# Patient Record
Sex: Female | Born: 1995 | Race: Black or African American | Hispanic: No | Marital: Single | Smoking: Never smoker
Health system: Southern US, Community
[De-identification: ages and names within clinical notes are randomized; demographics above are authoritative.]

## PROBLEM LIST (undated history)

## (undated) ENCOUNTER — Inpatient Hospital Stay (HOSPITAL_COMMUNITY): Payer: Self-pay

## (undated) DIAGNOSIS — O139 Gestational [pregnancy-induced] hypertension without significant proteinuria, unspecified trimester: Secondary | ICD-10-CM

## (undated) HISTORY — DX: Gestational (pregnancy-induced) hypertension without significant proteinuria, unspecified trimester: O13.9

---

## 2015-01-08 NOTE — L&D Delivery Note (Signed)
20 y.o. [id number] at 6676w5d delivered a viable female infant in cephalic, ROA position. Tight nuchal cord, easily reduced.  Anterior shoulder delivered with ease. 60 sec delayed cord clamping. Cord clamped x2 and cut. Placenta delivered spontaneously intact, with 3VC. Fundus firm on exam with massage and pitocin. Good hemostasis noted.  Laceration: None Suture: N/A Good hemostasis noted. EBL: 100 cc   Mom and baby recovering in LDR.    Apgars: 5, 8, 9  Code Apgar called.  Cord pH:  Weight: pending   Freddrick MarchYashika Amin, MD PGY-1 12/18/2015, 3:41 AM

## 2015-05-03 ENCOUNTER — Encounter (HOSPITAL_COMMUNITY): Payer: Self-pay | Admitting: Emergency Medicine

## 2015-05-03 ENCOUNTER — Emergency Department (HOSPITAL_COMMUNITY)
Admission: EM | Admit: 2015-05-03 | Discharge: 2015-05-03 | Disposition: A | Discharge status: Home / Self Care | Payer: Medicaid Other | Attending: Emergency Medicine | Admitting: Emergency Medicine

## 2015-05-03 ENCOUNTER — Emergency Department (HOSPITAL_COMMUNITY): Payer: Medicaid Other

## 2015-05-03 DIAGNOSIS — Z3490 Encounter for supervision of normal pregnancy, unspecified, unspecified trimester: Secondary | ICD-10-CM

## 2015-05-03 DIAGNOSIS — R109 Unspecified abdominal pain: Secondary | ICD-10-CM | POA: Diagnosis not present

## 2015-05-03 DIAGNOSIS — O9989 Other specified diseases and conditions complicating pregnancy, childbirth and the puerperium: Secondary | ICD-10-CM | POA: Diagnosis present

## 2015-05-03 DIAGNOSIS — O219 Vomiting of pregnancy, unspecified: Secondary | ICD-10-CM | POA: Diagnosis not present

## 2015-05-03 DIAGNOSIS — Z3A01 Less than 8 weeks gestation of pregnancy: Secondary | ICD-10-CM | POA: Diagnosis not present

## 2015-05-03 LAB — URINE MICROSCOPIC-ADD ON

## 2015-05-03 LAB — COMPREHENSIVE METABOLIC PANEL
ALK PHOS: 38 U/L (ref 38–126)
ALT: 13 U/L — ABNORMAL LOW (ref 14–54)
ANION GAP: 11 (ref 5–15)
AST: 15 U/L (ref 15–41)
Albumin: 4.3 g/dL (ref 3.5–5.0)
BILIRUBIN TOTAL: 0.6 mg/dL (ref 0.3–1.2)
BUN: <5 mg/dL — ABNORMAL LOW (ref 6–20)
CALCIUM: 9.4 mg/dL (ref 8.9–10.3)
CO2: 19 mmol/L — ABNORMAL LOW (ref 22–32)
Chloride: 105 mmol/L (ref 101–111)
Creatinine, Ser: 0.66 mg/dL (ref 0.44–1.00)
GFR calc non Af Amer: >60 mL/min (ref >60)
GLUCOSE: 94 mg/dL (ref 65–99)
Potassium: 3.4 mmol/L — ABNORMAL LOW (ref 3.5–5.1)
Sodium: 135 mmol/L (ref 135–145)
TOTAL PROTEIN: 7.8 g/dL (ref 6.5–8.1)

## 2015-05-03 LAB — URINALYSIS, ROUTINE W REFLEX MICROSCOPIC
Glucose, UA: NEGATIVE mg/dL
Hgb urine dipstick: NEGATIVE
Ketones, ur: >80 mg/dL — AB
Leukocytes, UA: NEGATIVE
NITRITE: NEGATIVE
PROTEIN: 100 mg/dL — AB
SPECIFIC GRAVITY, URINE: 1.037 — AB (ref 1.005–1.030)
pH: 6.5 (ref 5.0–8.0)

## 2015-05-03 LAB — CBC
HCT: 38.6 % (ref 36.0–46.0)
HEMOGLOBIN: 13.2 g/dL (ref 12.0–15.0)
MCH: 25.9 pg — ABNORMAL LOW (ref 26.0–34.0)
MCHC: 34.2 g/dL (ref 30.0–36.0)
MCV: 75.7 fL — ABNORMAL LOW (ref 78.0–100.0)
PLATELETS: 218 10*3/uL (ref 150–400)
RBC: 5.1 MIL/uL (ref 3.87–5.11)
RDW: 13.2 % (ref 11.5–15.5)
WBC: 8.9 10*3/uL (ref 4.0–10.5)

## 2015-05-03 LAB — LIPASE, BLOOD: Lipase: 25 U/L (ref 11–51)

## 2015-05-03 LAB — POC URINE PREG, ED: PREG TEST UR: POSITIVE — AB

## 2015-05-03 MED ORDER — PRENATAL COMPLETE 14-0.4 MG PO TABS: 1.0000 | ORAL_TABLET | Freq: Every day | ORAL | Status: AC

## 2015-05-03 MED ORDER — ONDANSETRON 4 MG PO TBDP: 4.0000 mg | ORAL_TABLET | Freq: Three times a day (TID) | ORAL | Status: DC | PRN

## 2015-05-03 MED ORDER — KCL IN DEXTROSE-NACL 20-5-0.45 MEQ/L-%-% IV SOLN
Freq: Once | INTRAVENOUS | Status: AC
  Administered 2015-05-03: 250 mL/h via INTRAVENOUS
  Filled 2015-05-03 (×2): qty 1000

## 2015-05-03 MED ORDER — ONDANSETRON HCL 4 MG/2ML IJ SOLN
4.0000 mg | Freq: Once | INTRAMUSCULAR | Status: AC
  Administered 2015-05-03: 4 mg via INTRAVENOUS
  Filled 2015-05-03: qty 2

## 2015-05-03 NOTE — Discharge Instructions (Signed)
As discussed, today's evaluation has been reassuring but with your early pregnancy and is important to follow-up with our women's health specialists.  Please call today to make an appropriate appointment.  Please stay well hydrated.  Do not hesitate to return here for concerning changes in your condition.

## 2015-05-03 NOTE — ED Provider Notes (Signed)
CSN: [id number]     Arrival date & time 05/03/15  0440 History   First MD Initiated Contact with Patient 05/03/15 0703     Chief Complaint  Patient presents with  . Abdominal Pain     (Consider location/radiation/quality/duration/timing/severity/associated sxs/prior Treatment) HPI Patient presents with concern of abdominal pain, nausea, vomiting. Symptoms began 1 day ago. Last menstrual.  About 2 months ago. Patient is unsure of pregnancy, but thinks that she is likely pregnant. Patient states that she is otherwise well, has one prior pregnancy. Since onset, no clearly or alleviating factors, pain is lower, and upper, diffuse, moderate.  History reviewed. No pertinent past medical history. History reviewed. No pertinent past surgical history. No family history on file. Social History  Substance Use Topics  . Smoking status: Never Smoker   . Smokeless tobacco: None  . Alcohol Use: No   OB History    Gravida Para Term Preterm AB TAB SAB Ectopic Multiple Living   1              Review of Systems  Constitutional:       Per HPI, otherwise negative  HENT:       Per HPI, otherwise negative  Respiratory:       Per HPI, otherwise negative  Cardiovascular:       Per HPI, otherwise negative  Gastrointestinal: Positive for vomiting.  Endocrine:       Negative aside from HPI  Genitourinary:       Neg aside from HPI   Musculoskeletal:       Per HPI, otherwise negative  Skin: Negative.   Neurological: Negative for syncope.      Allergies  Review of patient's allergies indicates no known allergies.  Home Medications   Prior to Admission medications   Medication Sig Start Date End Date Taking? Authorizing Provider  ondansetron (ZOFRAN ODT) 4 MG disintegrating tablet Take 1 tablet (4 mg total) by mouth every 8 (eight) hours as needed for nausea or vomiting. 05/03/15   Gerhard Munchobert Emmalise Huard, MD  Prenatal Vit-Fe Fumarate-FA (PRENATAL COMPLETE) 14-0.4 MG TABS Take 1 tablet by mouth  daily. 05/03/15   Gerhard Munchobert Deepika Decatur, MD   BP 120/86 mmHg  Pulse 94  Temp(Src) 98.5 F (36.9 C) (Oral)  Resp 18  Ht 5' (1.524 m)  Wt 163 lb 1 oz (73.965 kg)  BMI 31.85 kg/m2  SpO2 99%  LMP 02/20/2015 (Approximate) Physical Exam  Constitutional: She is oriented to person, place, and time. She appears well-developed and well-nourished. No distress.  HENT:  Head: Normocephalic and atraumatic.  Eyes: Conjunctivae and EOM are normal.  Cardiovascular: Normal rate and regular rhythm.   Pulmonary/Chest: Effort normal and breath sounds normal. No stridor. No respiratory distress.  Abdominal: She exhibits no distension.  No substantial discomfort, mild guarding, no peritoneal findings  Musculoskeletal: She exhibits no edema.  Neurological: She is alert and oriented to person, place, and time. No cranial nerve deficit.  Skin: Skin is warm and dry.  Psychiatric: She has a normal mood and affect.  Nursing note and vitals reviewed.   ED Course  Procedures (including critical care time) Labs Review Labs Reviewed  COMPREHENSIVE METABOLIC PANEL - Abnormal; Notable for the following:    Potassium 3.4 (*)    CO2 19 (*)    BUN <5 (*)    ALT 13 (*)    All other components within normal limits  CBC - Abnormal; Notable for the following:    MCV 75.7 (*)  MCH 25.9 (*)    All other components within normal limits  URINALYSIS, ROUTINE W REFLEX MICROSCOPIC (NOT AT [facility]) - Abnormal; Notable for the following:    Color, Urine AMBER (*)    APPearance CLOUDY (*)    Specific Gravity, Urine 1.037 (*)    Bilirubin Urine SMALL (*)    Ketones, ur >80 (*)    Protein, ur 100 (*)    All other components within normal limits  URINE MICROSCOPIC-ADD ON - Abnormal; Notable for the following:    Squamous Epithelial / LPF 0-5 (*)    Bacteria, UA RARE (*)    All other components within normal limits  POC URINE PREG, ED - Abnormal; Notable for the following:    Preg Test, Ur POSITIVE (*)    All other  components within normal limits  LIPASE, BLOOD    Imaging Review US Ob Comp Less 14 Wks  05/03/2015  CLINICAL DATA:  Positive urine pregnancy test with pelvic pain for 1 day. EXAM: OBSTETRIC <14 WK [place] AND TRANSVAGINAL OB US TECHNIQUE: Both transabdominal and transvaginal ultrasound examinations were performed for complete evaluation of the gestation as well as the maternal uterus, adnexal regions, and pelvic cul-de-sac. Transvaginal technique was performed to assess early pregnancy. COMPARISON:  None. FINDINGS: Intrauterine gestational sac: Single, normal in configuration. Yolk sac:  Visualized. Embryo:  Visualized. Cardiac Activity: Visualized. Heart Rate: 132  bpm CRL:  0.95  mm   7 w   0 d                  [place] EDC: 12/20/2015 Subchorionic hemorrhage:  None visualized. Maternal uterus/adnexae: Within normal limits. Ovaries are visualized. No free fluid. IMPRESSION: Single living intrauterine pregnancy with gestational age of [redacted] weeks 0 days and estimated date of confinement 12/20/2015. No complicating features. Electronically Signed   By: Leanna Battles M.D.   On: 05/03/2015 07:59   US Ob Transvaginal  05/03/2015  CLINICAL DATA:  Positive urine pregnancy test with pelvic pain for 1 day. EXAM: OBSTETRIC <14 WK [place] AND TRANSVAGINAL OB US TECHNIQUE: Both transabdominal and transvaginal ultrasound examinations were performed for complete evaluation of the gestation as well as the maternal uterus, adnexal regions, and pelvic cul-de-sac. Transvaginal technique was performed to assess early pregnancy. COMPARISON:  None. FINDINGS: Intrauterine gestational sac: Single, normal in configuration. Yolk sac:  Visualized. Embryo:  Visualized. Cardiac Activity: Visualized. Heart Rate: 132  bpm CRL:  0.95  mm   7 w   0 d                  [place] EDC: 12/20/2015 Subchorionic hemorrhage:  None visualized. Maternal uterus/adnexae: Within normal limits. Ovaries are visualized. No free fluid. IMPRESSION: Single living intrauterine  pregnancy with gestational age of [redacted] weeks 0 days and estimated date of confinement 12/20/2015. No complicating features. Electronically Signed   By: Leanna Battles M.D.   On: 05/03/2015 07:59   Generally well-appearing female presents early in pregnancy with mild abdominal pain, nausea, vomiting. Here, the patient is mildly tachycardic, is nonischemic and urea, but improved substantially with fluid resuscitation with D5, half-normal saline. Ultrasound shows IUP, no evidence for free abdominal fluid, ectopic. Patient discharged with appropriate follow-up with our obstetrics team.   Gerhard Munch, MD 05/03/15 [phone]

## 2015-05-03 NOTE — ED Notes (Signed)
Patient with abdominal pain with nausea and vomiting.  This started yesterday.  LMP February 2017, she states that she is pregnant and does not know how far along she is.

## 2015-05-03 NOTE — ED Notes (Signed)
[facility] called, Christy OB nurse state to order a stat OB US to find out gestation time. EDP to be notified.

## 2015-07-14 ENCOUNTER — Inpatient Hospital Stay (HOSPITAL_COMMUNITY)
Admission: AD | Admit: 2015-07-14 | Discharge: 2015-07-14 | Disposition: A | Discharge status: Home / Self Care | Payer: Medicaid - Out of State | Source: Ambulatory Visit | Attending: Obstetrics & Gynecology | Admitting: Obstetrics & Gynecology

## 2015-07-14 ENCOUNTER — Encounter (HOSPITAL_COMMUNITY): Payer: Self-pay | Admitting: *Deleted

## 2015-07-14 DIAGNOSIS — R51 Headache: Secondary | ICD-10-CM | POA: Diagnosis present

## 2015-07-14 DIAGNOSIS — Z3A17 17 weeks gestation of pregnancy: Secondary | ICD-10-CM | POA: Diagnosis present

## 2015-07-14 DIAGNOSIS — O26892 Other specified pregnancy related conditions, second trimester: Secondary | ICD-10-CM | POA: Insufficient documentation

## 2015-07-14 DIAGNOSIS — Z711 Person with feared health complaint in whom no diagnosis is made: Secondary | ICD-10-CM

## 2015-07-14 DIAGNOSIS — O0932 Supervision of pregnancy with insufficient antenatal care, second trimester: Secondary | ICD-10-CM | POA: Diagnosis not present

## 2015-07-14 LAB — URINALYSIS, ROUTINE W REFLEX MICROSCOPIC
BILIRUBIN URINE: NEGATIVE
GLUCOSE, UA: NEGATIVE mg/dL
HGB URINE DIPSTICK: NEGATIVE
Ketones, ur: NEGATIVE mg/dL
Leukocytes, UA: NEGATIVE
Nitrite: NEGATIVE
PH: 5.5 (ref 5.0–8.0)
Protein, ur: NEGATIVE mg/dL
Specific Gravity, Urine: 1.025 (ref 1.005–1.030)

## 2015-07-14 NOTE — MAU Note (Signed)
Urine in lab 

## 2015-07-14 NOTE — MAU Provider Note (Signed)
History     CSN: [id number]  Arrival date and time: 07/14/15 1112   First Provider Initiated Contact with Patient 07/14/15 1201      No chief complaint on file.  HPI   Sydney Weaver is a 20 y.o. [id number] @[redacted]w[redacted]d  presenting with complaint of h/a yesterday. She states she got a h/a yesterday while watching tv. Rates it 5/10 pain localized at the front of her head. H/a lasted 2-3 hours and resolved upon drinking water and going to sleep. Denied any visual changes, vomiting, numbness or tingling. Normally gets these kinds of headaches once a month. She is not experiencing any pain or headache currently. Denies vaginal bleeding/discharge/loss of fluids, decreased fetal movements, or contractions. Patient has not established prenatal care since moving back to [place] a few weeks ago.   OB History    Gravida Para Term Preterm AB TAB SAB Ectopic Multiple Living   4    2  2          History reviewed. No pertinent past medical history.  History reviewed. No pertinent past surgical history.  History reviewed. No pertinent family history.  Social History  Substance Use Topics  . Smoking status: Never Smoker   . Smokeless tobacco: None  . Alcohol Use: No    Allergies: No Known Allergies  Prescriptions prior to admission  Medication Sig Dispense Refill Last Dose  . ondansetron (ZOFRAN ODT) 4 MG disintegrating tablet Take 1 tablet (4 mg total) by mouth every 8 (eight) hours as needed for nausea or vomiting. 20 tablet 0 Past Week at Unknown time  . Prenatal Vit-Fe Fumarate-FA (PRENATAL COMPLETE) 14-0.4 MG TABS Take 1 tablet by mouth daily. 60 each 0 07/13/2015 at Unknown time   Results for orders placed or performed during the hospital encounter of 07/14/15 (from the past 72 hour(s))  Urinalysis, Routine w reflex microscopic (not at [facility])     Status: None   Collection Time: 07/14/15 11:37 AM  Result Value Ref Range   Color, Urine YELLOW YELLOW   APPearance CLEAR CLEAR   Specific Gravity,  Urine 1.025 1.005 - 1.030   pH 5.5 5.0 - 8.0   Glucose, UA NEGATIVE NEGATIVE mg/dL   Hgb urine dipstick NEGATIVE NEGATIVE   Bilirubin Urine NEGATIVE NEGATIVE   Ketones, ur NEGATIVE NEGATIVE mg/dL   Protein, ur NEGATIVE NEGATIVE mg/dL   Nitrite NEGATIVE NEGATIVE   Leukocytes, UA NEGATIVE NEGATIVE    Comment: MICROSCOPIC NOT DONE ON URINES WITH NEGATIVE PROTEIN, BLOOD, LEUKOCYTES, NITRITE, OR GLUCOSE <1000 mg/dL.    Review of Systems  Constitutional: Negative for fever and chills.  Gastrointestinal: Negative for nausea, vomiting, abdominal pain, diarrhea and constipation.  Genitourinary: Negative for dysuria and urgency.   Physical Exam   Blood pressure 105/58, pulse 88, temperature 97.6 F (36.4 C), temperature source Oral, resp. rate 19, height 5\' 2"  (1.575 m), weight 152 lb 8 oz (69.174 kg), last menstrual period 02/20/2015, SpO2 100 %.  Physical Exam  Constitutional: She is oriented to person, place, and time. She appears well-developed and well-nourished. No distress.  HENT:  Head: Normocephalic.  Eyes: Pupils are equal, round, and reactive to light.  GI: Soft.  Musculoskeletal: Normal range of motion.  Neurological: She is alert and oriented to person, place, and time.  Skin: Skin is warm. She is not diaphoretic.  Psychiatric: Her behavior is normal. Thought content normal.    MAU Course  Procedures  None  MDM  UA + fetal kick counts   Assessment and Plan  A:  1. Physically well but worried   2. No prenatal care in current pregnancy, second trimester     P:  Discharge home in stable condition [place] [facility]. Return to MAU if symptoms worsen Second trimester warning signs discussed Prenatal vitamins daily    Duane LopeJennifer I Miloh Alcocer, NP 07/16/2015 8:55 AM

## 2015-07-14 NOTE — Discharge Instructions (Signed)
How a Baby Grows During Pregnancy Pregnancy begins when a female's sperm enters a female's egg (fertilization). This happens in one of the tubes (fallopian tubes) that connect the ovaries to the womb (uterus). The fertilized egg is called an embryo until it reaches 10 weeks. From 10 weeks until birth, it is called a fetus. The fertilized egg moves down the fallopian tube to the uterus. Then it implants into the lining of the uterus and begins to grow. The developing fetus receives oxygen and nutrients through the pregnant woman's bloodstream and the tissues that grow (placenta) to support the fetus. The placenta is the life support system for the fetus. It provides nutrition and removes waste. Learning as much as you can about your pregnancy and how your baby is developing can help you enjoy the experience. It can also make you aware of when there might be a problem and when to ask questions. HOW LONG DOES A TYPICAL PREGNANCY LAST? A pregnancy usually lasts 280 days, or about 40 weeks. Pregnancy is divided into three trimesters:  First trimester: 0-13 weeks.  Second trimester: 14-27 weeks.  Third trimester: 28-40 weeks. The day when your baby is considered ready to be born (full term) is your estimated date of delivery. HOW DOES MY BABY DEVELOP MONTH BY MONTH? First month  The fertilized egg attaches to the inside of the uterus.  Some cells will form the placenta. Others will form the fetus.  The arms, legs, brain, spinal cord, lungs, and heart begin to develop.  At the end of the first month, the heart begins to beat. Second month  The bones, inner ear, eyelids, hands, and feet form.  The genitals develop.  By the end of 8 weeks, all major organs are developing. Third month  All of the internal organs are forming.  Teeth develop below the gums.  Bones and muscles begin to grow. The spine can flex.  The skin is transparent.  Fingernails and toenails begin to form.  Arms and  legs continue to grow longer, and hands and feet develop.  The fetus is about 3 in (7.6 cm) long. Fourth month  The placenta is completely formed.  The external sex organs, neck, outer ear, eyebrows, eyelids, and fingernails are formed.  The fetus can hear, swallow, and move its arms and legs.  The kidneys begin to produce urine.  The skin is covered with a white waxy coating (vernix) and very fine hair (lanugo). Fifth month  The fetus moves around more and can be felt for the first time (quickening).  The fetus starts to sleep and wake up and may begin to suck its finger.  The nails grow to the end of the fingers.  The organ in the digestive system that makes bile (gallbladder) functions and helps to digest the nutrients.  If your baby is a girl, eggs are present in her ovaries. If your baby is a boy, testicles start to move down into his scrotum. Sixth month  The lungs are formed, but the fetus is not yet able to breathe.  The eyes open. The brain continues to develop.  Your baby has fingerprints and toe prints. Your baby's hair grows thicker.  At the end of the second trimester, the fetus is about 9 in (22.9 cm) long. Seventh month  The fetus kicks and stretches.  The eyes are developed enough to sense changes in light.  The hands can make a grasping motion.  The fetus responds to sound. Eighth month  All organs and body systems are fully developed and functioning.  Bones harden and taste buds develop. The fetus may hiccup.  Certain areas of the brain are still developing. The skull remains soft. Ninth month  The fetus gains about  lb (0.23 kg) each week.  The lungs are fully developed.  Patterns of sleep develop.  The fetus's head typically moves into a head-down position (vertex) in the uterus to prepare for birth. If the buttocks move into a vertex position instead, the baby is breech.  The fetus weighs 6-9 lbs (2.72-4.08 kg) and is 19-20 in  (48.26-50.8 cm) long. WHAT CAN I DO TO HAVE A HEALTHY PREGNANCY AND HELP MY BABY DEVELOP? Eating and Drinking  Eat a healthy diet.  Talk with your health care provider to make sure that you are getting the nutrients that you and your baby need.  Visit www.DisposableNylon.bechoosemyplate.gov to learn about creating a healthy diet.  Gain a healthy amount of weight during pregnancy as advised by your health care provider. This is usually 25-35 pounds. You may need to:  Gain more if you were underweight before getting pregnant or if you are pregnant with more than one baby.  Gain less if you were overweight or obese when you got pregnant. Medicines and Vitamins  Take prenatal vitamins as directed by your health care provider. These include vitamins such as folic acid, iron, calcium, and vitamin D. They are important for healthy development.  Take medicines only as directed by your health care provider. Read labels and ask a pharmacist or your health care provider whether over-the-counter medicines, supplements, and prescription drugs are safe to take during pregnancy. Activities  Be physically active as advised by your health care provider. Ask your health care provider to recommend activities that are safe for you to do, such as walking or swimming.  Do not participate in strenuous or extreme sports. Lifestyle  Do not drink alcohol.  Do not use any tobacco products, including cigarettes, chewing tobacco, or electronic cigarettes. If you need help quitting, ask your health care provider.  Do not use illegal drugs. Safety  Avoid exposure to mercury, lead, or other heavy metals. Ask your health care provider about common sources of these heavy metals.  Avoid listeria infection during pregnancy. Follow these precautions:  Do not eat soft cheeses or deli meats.  Do not eat hot dogs unless they have been warmed up to the point of steaming, such as in the microwave oven.  Do not drink unpasteurized  milk.  Avoid toxoplasmosis infection during pregnancy. Follow these precautions:  Do not change your cat's litter box, if you have a cat. Ask someone else to do this for you.  Wear gardening gloves while working in the yard. General Instructions  Keep all follow-up visits as directed by your health care provider. This is important. This includes prenatal care and screening tests.  Manage any chronic health conditions. Work closely with your health care provider to keep conditions, such as diabetes, under control. HOW DO I KNOW IF MY BABY IS DEVELOPING WELL? At each prenatal visit, your health care provider will do several different tests to check on your health and keep track of your baby's development. These include:  Fundal height.  Your health care provider will measure your growing belly from top to bottom using a tape measure.  Your health care provider will also feel your belly to determine your baby's position.  Heartbeat.  An ultrasound in the first trimester can confirm  pregnancy and show a heartbeat, depending on how far along you are.  Your health care provider will check your baby's heart rate at every prenatal visit.  As you get closer to your delivery date, you may have regular fetal heart rate monitoring to make sure that your baby is not in distress.  Second trimester ultrasound.  This ultrasound checks your baby's development. It also indicates your baby's gender. WHAT SHOULD I DO IF I HAVE CONCERNS ABOUT MY BABY'S DEVELOPMENT? Always talk with your health care provider about any concerns that you may have.   This information is not intended to replace advice given to you by your health care provider. Make sure you discuss any questions you have with your health care provider.   Document Released: 06/12/2007 Document Revised: 09/14/2014 Document Reviewed: 06/02/2013 Elsevier Interactive Patient Education 2016 [employer].  Prenatal Care WHAT IS PRENATAL  CARE?  Prenatal care is the process of caring for a pregnant woman before she gives birth. Prenatal care makes sure that she and her baby remain as healthy as possible throughout pregnancy. Prenatal care may be provided by a midwife, family practice health care provider, or a childbirth and pregnancy specialist (obstetrician). Prenatal care may include physical examinations, testing, treatments, and education on nutrition, lifestyle, and social support services. WHY IS PRENATAL CARE SO IMPORTANT?  Early and consistent prenatal care increases the chance that you and your baby will remain healthy throughout your pregnancy. This type of care also decreases a baby's risk of being born too early (prematurely), or being born smaller than expected (small for gestational age). Any underlying medical conditions you may have that could pose a risk during your pregnancy are discussed during prenatal care visits. You will also be monitored regularly for any new conditions that may arise during your pregnancy so they can be treated quickly and effectively. WHAT HAPPENS DURING PRENATAL CARE VISITS? Prenatal care visits may include the following: Discussion Tell your health care provider about any new signs or symptoms you have experienced since your last visit. These might include:  Nausea or vomiting.  Increased or decreased level of energy.  Difficulty sleeping.  Back or leg pain.  Weight changes.  Frequent urination.  Shortness of breath with physical activity.  Changes in your skin, such as the development of a rash or itchiness.  Vaginal discharge or bleeding.  Feelings of excitement or nervousness.  Changes in your baby's movements. You may want to write down any questions or topics you want to discuss with your health care provider and bring them with you to your appointment. Examination During your first prenatal care visit, you will likely have a complete physical exam. Your health care  provider will often examine your vagina, cervix, and the position of your uterus, as well as check your heart, lungs, and other body systems. As your pregnancy progresses, your health care provider will measure the size of your uterus and your baby's position inside your uterus. He or she may also examine you for early signs of labor. Your prenatal visits may also include checking your blood pressure and, after about 10-12 weeks of pregnancy, listening to your baby's heartbeat. Testing Regular testing often includes:  Urinalysis. This checks your urine for glucose, protein, or signs of infection.  Blood count. This checks the levels of white and red blood cells in your body.  Tests for sexually transmitted infections (STIs). Testing for STIs at the beginning of pregnancy is routinely done and is required in many  states.  Antibody testing. You will be checked to see if you are immune to certain illnesses, such as rubella, that can affect a developing fetus.  Glucose screen. Around 24-28 weeks of pregnancy, your blood glucose level will be checked for signs of gestational diabetes. Follow-up tests may be recommended.  Group B strep. This is a bacteria that is commonly found inside a woman's vagina. This test will inform your health care provider if you need an antibiotic to reduce the amount of this bacteria in your body prior to labor and childbirth.  Ultrasound. Many pregnant women undergo an ultrasound screening around 18-20 weeks of pregnancy to evaluate the health of the fetus and check for any developmental abnormalities.  HIV (human immunodeficiency virus) testing. Early in your pregnancy, you will be screened for HIV. If you are at high risk for HIV, this test may be repeated during your third trimester of pregnancy. You may be offered other testing based on your age, personal or family medical history, or other factors.  HOW OFTEN SHOULD I PLAN TO SEE MY HEALTH CARE PROVIDER FOR PRENATAL  CARE? Your prenatal care check-up schedule depends on any medical conditions you have before, or develop during, your pregnancy. If you do not have any underlying medical conditions, you will likely be seen for checkups:  Monthly, during the first 6 months of pregnancy.  Twice a month during months 7 and 8 of pregnancy.  Weekly starting in the 9th month of pregnancy and until delivery. If you develop signs of early labor or other concerning signs or symptoms, you may need to see your health care provider more often. Ask your health care provider what prenatal care schedule is best for you. WHAT CAN I DO TO KEEP MYSELF AND MY BABY AS HEALTHY AS POSSIBLE DURING MY PREGNANCY?  Take a prenatal vitamin containing 400 micrograms (0.4 mg) of folic acid every day. Your health care provider may also ask you to take additional vitamins such as iodine, vitamin D, iron, copper, and zinc.  Take 1500-2000 mg of calcium daily starting at your 20th week of pregnancy until you deliver your baby.  Make sure you are up to date on your vaccinations. Unless directed otherwise by your health care provider:  You should receive a tetanus, diphtheria, and pertussis (Tdap) vaccination between the 27th and 36th week of your pregnancy, regardless of when your last Tdap immunization occurred. This helps protect your baby from whooping cough (pertussis) after he or she is born.  You should receive an annual inactivated influenza vaccine (IIV) to help protect you and your baby from influenza. This can be done at any point during your pregnancy.  Eat a well-rounded diet that includes:  Fresh fruits and vegetables.  Lean proteins.  Calcium-rich foods such as milk, yogurt, hard cheeses, and dark, leafy greens.  Whole grain breads.  Do noteat seafood high in mercury, including:  Swordfish.  Tilefish.  Shark.  King mackerel.  More than 6 oz tuna per week.  Do not eat:  Raw or undercooked meats or  eggs.  Unpasteurized foods, such as soft cheeses (brie, blue, or feta), juices, and milks.  Lunch meats.  Hot dogs that have not been heated until they are steaming.  Drink enough water to keep your urine clear or pale yellow. For many women, this may be 10 or more 8 oz glasses of water each day. Keeping yourself hydrated helps deliver nutrients to your baby and may prevent the start of pre-term uterine  contractions.  Do not use any tobacco products including cigarettes, chewing tobacco, or electronic cigarettes. If you need help quitting, ask your health care provider.  Do not drink beverages containing alcohol. No safe level of alcohol consumption during pregnancy has been determined.  Do not use any illegal drugs. These can harm your developing baby or cause a miscarriage.  Ask your health care provider or pharmacist before taking any prescription or over-the-counter medicines, herbs, or supplements.  Limit your caffeine intake to no more than 200 mg per day.  Exercise. Unless told otherwise by your health care provider, try to get 30 minutes of moderate exercise most days of the week. Do not  do high-impact activities, contact sports, or activities with a high risk of falling, such as horseback riding or downhill skiing.  Get plenty of rest.  Avoid anything that raises your body temperature, such as hot tubs and saunas.  If you own a cat, do not empty its litter box. Bacteria contained in cat feces can cause an infection called toxoplasmosis. This can result in serious harm to the fetus.  Stay away from chemicals such as insecticides, lead, mercury, and cleaning or paint products that contain solvents.  Do not have any X-rays taken unless medically necessary.  Take a childbirth and breastfeeding preparation class. Ask your health care provider if you need a referral or recommendation.   This information is not intended to replace advice given to you by your health care provider.  Make sure you discuss any questions you have with your health care provider.   Document Released: 12/27/2002 Document Revised: 01/14/2014 Document Reviewed: 03/10/2013 Elsevier Interactive Patient Education 2016 [employer]   Safe Medications in Pregnancy   Acne:  Benzoyl Peroxide  Salicylic Acid   Backache/Headache:  Tylenol: 2 regular strength every 4 hours OR        2 Extra strength every 6 hours   Colds/Coughs/Allergies:  Benadryl (alcohol free) 25 mg every 6 hours as needed  Breath right strips  Claritin  Cepacol throat lozenges  Chloraseptic throat spray  Cold-Eeze- up to three times per day  Cough drops, alcohol free  Flonase (by prescription only)  Guaifenesin  Mucinex  Robitussin DM (plain only, alcohol free)  Saline nasal spray/drops  Sudafed (pseudoephedrine) & Actifed * use only after [redacted] weeks gestation and if you do not have high blood pressure  Tylenol  Vicks Vaporub  Zinc lozenges  Zyrtec   Constipation:  Colace  Ducolax suppositories  Fleet enema  Glycerin suppositories  Metamucil  Milk of magnesia  Miralax  Senokot  Smooth move tea   Diarrhea:  Kaopectate  Imodium A-D   *NO pepto Bismol   Hemorrhoids:  Anusol  Anusol HC  Preparation H  Tucks   Indigestion:  Tums  Maalox  Mylanta  Zantac  Pepcid   Insomnia:  Benadryl (alcohol free) 25mg  every 6 hours as needed  Tylenol PM  Unisom, no Gelcaps   Leg Cramps:  Tums  MagGel   Nausea/Vomiting:  Bonine  Dramamine  Emetrol  Ginger extract  Sea bands  Meclizine  Nausea medication to take during pregnancy:  Unisom (doxylamine succinate 25 mg tablets) Take one tablet daily at bedtime. If symptoms are not adequately controlled, the dose can be increased to a maximum recommended dose of two tablets daily (1/2 tablet in the morning, 1/2 tablet mid-afternoon and one at bedtime).  Vitamin B6 100mg  tablets. Take one tablet twice a day (up to 200 mg per day).  Skin Rashes:   Aveeno products  Benadryl cream or 25mg  every 6 hours as needed  Calamine Lotion  1% cortisone cream   Yeast infection:  Gyne-lotrimin 7  Monistat 7    **If taking multiple medications, please check labels to avoid duplicating the same active ingredients  **take medication as directed on the label  ** Do not exceed 4000 mg of tylenol in 24 hours  **Do not take medications that contain aspirin or ibuprofen

## 2015-07-14 NOTE — MAU Note (Signed)
Patient presents to [facility] with c/o of headache and dizziness that she experienced yesterday but not today. Reports to RN " I just don't feel well".

## 2015-07-14 NOTE — MAU Note (Signed)
Patient endorses just moving here from [place] and is still looking for an OB provider in the area.

## 2015-07-25 ENCOUNTER — Encounter (HOSPITAL_COMMUNITY): Payer: Self-pay | Admitting: Emergency Medicine

## 2015-07-25 ENCOUNTER — Ambulatory Visit (HOSPITAL_COMMUNITY)
Admission: EM | Admit: 2015-07-25 | Discharge: 2015-07-25 | Disposition: A | Discharge status: Home / Self Care | Payer: 59 | Attending: Family Medicine | Admitting: Family Medicine

## 2015-07-25 DIAGNOSIS — L049 Acute lymphadenitis, unspecified: Secondary | ICD-10-CM

## 2015-07-25 MED ORDER — AMOXICILLIN 500 MG PO CAPS: 500.0000 mg | ORAL_CAPSULE | Freq: Three times a day (TID) | ORAL | Status: DC

## 2015-07-25 NOTE — ED Notes (Signed)
[facility] with a complaint of neck pain x 3 days.

## 2015-07-25 NOTE — ED Provider Notes (Signed)
CSN: [id number]     Arrival date & time 07/25/15  1009 History   First MD Initiated Contact with Patient 07/25/15 1106     Chief Complaint  Patient presents with  . Neck Pain   (Consider location/radiation/quality/duration/timing/severity/associated sxs/prior Treatment) Patient is a 20 y.o. female presenting with neck pain. The history is provided by the patient and the spouse.  Neck Pain Pain location:  L side Quality:  Aching and shooting Pain radiates to:  Does not radiate Pain severity:  Mild Onset quality:  Sudden Duration:  3 days Progression:  Unchanged Chronicity:  New Relieved by:  None tried Worsened by:  Nothing tried Ineffective treatments:  None tried Associated symptoms: no fever and no headaches     History reviewed. No pertinent past medical history. History reviewed. No pertinent past surgical history. History reviewed. No pertinent family history. Social History  Substance Use Topics  . Smoking status: Never Smoker   . Smokeless tobacco: None  . Alcohol Use: No   OB History    Gravida Para Term Preterm AB TAB SAB Ectopic Multiple Living   4    2  2         Review of Systems  Constitutional: Negative.  Negative for fever.  HENT: Negative.   Musculoskeletal: Positive for neck pain. Negative for neck stiffness.  Neurological: Negative for headaches.  Hematological: Positive for adenopathy.  All other systems reviewed and are negative.   Allergies  Review of patient's allergies indicates no known allergies.  Home Medications   Prior to Admission medications   Medication Sig Start Date End Date Taking? Authorizing Provider  ondansetron (ZOFRAN ODT) 4 MG disintegrating tablet Take 1 tablet (4 mg total) by mouth every 8 (eight) hours as needed for nausea or vomiting. 05/03/15  Yes Gerhard Munchobert Lockwood, MD  Prenatal Vit-Fe Fumarate-FA (PRENATAL COMPLETE) 14-0.4 MG TABS Take 1 tablet by mouth daily. 05/03/15  Yes Gerhard Munchobert Lockwood, MD  amoxicillin (AMOXIL) 500  MG capsule Take 1 capsule (500 mg total) by mouth 3 (three) times daily. 07/25/15   Linna HoffJames D Kindl, MD   Meds Ordered and Administered this Visit  Medications - No data to display  BP 101/70 mmHg  Pulse 98  Temp(Src) 98.1 F (36.7 C) (Oral)  Resp 18  SpO2 100%  LMP 02/20/2015 (Approximate) No data found.   Physical Exam  Constitutional: She is oriented to person, place, and time. She appears well-developed and well-nourished. No distress.  HENT:  Right Ear: External ear normal.  Left Ear: External ear normal.  Mouth/Throat: Oropharynx is clear and moist.  Neck: Normal range of motion. Neck supple.  Lymphadenopathy:    She has cervical adenopathy.  Neurological: She is alert and oriented to person, place, and time.  Skin: Skin is warm and dry.  Nursing note and vitals reviewed.   ED Course  Procedures (including critical care time)  Labs Review Labs Reviewed - No data to display  Imaging Review No results found.   Visual Acuity Review  Right Eye Distance:   Left Eye Distance:   Bilateral Distance:    Right Eye Near:   Left Eye Near:    Bilateral Near:         MDM   1. Lymphadenitis, acute        Linna HoffJames D Kindl, MD 07/25/15 1154

## 2015-07-25 NOTE — Discharge Instructions (Signed)
Take all of medicine, see your doctor if further problem

## 2015-08-03 ENCOUNTER — Ambulatory Visit (INDEPENDENT_AMBULATORY_CARE_PROVIDER_SITE_OTHER): Payer: Medicaid - Out of State | Admitting: Obstetrics & Gynecology

## 2015-08-03 ENCOUNTER — Encounter: Payer: Self-pay | Admitting: Obstetrics & Gynecology

## 2015-08-03 VITALS — BP 89/54 | HR 98 | Ht 64.0 in | Wt 153.3 lb

## 2015-08-03 DIAGNOSIS — Z8759 Personal history of other complications of pregnancy, childbirth and the puerperium: Secondary | ICD-10-CM

## 2015-08-03 DIAGNOSIS — O0990 Supervision of high risk pregnancy, unspecified, unspecified trimester: Secondary | ICD-10-CM | POA: Insufficient documentation

## 2015-08-03 DIAGNOSIS — Z3482 Encounter for supervision of other normal pregnancy, second trimester: Secondary | ICD-10-CM | POA: Diagnosis present

## 2015-08-03 DIAGNOSIS — O0992 Supervision of high risk pregnancy, unspecified, second trimester: Secondary | ICD-10-CM | POA: Diagnosis not present

## 2015-08-03 DIAGNOSIS — Z3492 Encounter for supervision of normal pregnancy, unspecified, second trimester: Secondary | ICD-10-CM

## 2015-08-03 LAB — POCT URINALYSIS DIP (DEVICE)
Bilirubin Urine: NEGATIVE
GLUCOSE, UA: NEGATIVE mg/dL
Hgb urine dipstick: NEGATIVE
KETONES UR: NEGATIVE mg/dL
Leukocytes, UA: NEGATIVE
Nitrite: NEGATIVE
PH: 6.5 (ref 5.0–8.0)
PROTEIN: NEGATIVE mg/dL
Specific Gravity, Urine: 1.025 (ref 1.005–1.030)
UROBILINOGEN UA: 0.2 mg/dL (ref 0.0–1.0)

## 2015-08-03 NOTE — Progress Notes (Signed)
Here for first prenatal visit. Given new prenatal education packet. States had initial prenatal care in [place].

## 2015-08-03 NOTE — Patient Instructions (Signed)

## 2015-08-03 NOTE — Progress Notes (Signed)
  Subjective: had a prenatal w/[place] in [place] is a [id number] [redacted]w[redacted]d being seen today for her first obstetrical visit.  Her obstetrical history is significant for h/o stillbirth at 7 mo. Patient does intend to breast feed. Pregnancy history fully reviewed.  Patient reports no complaints.  Vitals:   08/03/15 0814 08/03/15 0816  BP: (!) 89/54   Pulse: 98   Weight: 69.5 kg (153 lb 4.8 oz)   Height:  5\' 4"  (1.626 m)    HISTORY: OB History  Gravida Para Term Preterm AB Living  4 2 1 1 1 1   SAB TAB Ectopic Multiple Live Births  1       1    # Outcome Date GA Lbr Len/2nd Weight Sex Delivery Anes PTL Lv  4 Current           3 SAB 2016          2 Term 07/06/12 [redacted]w[redacted]d  3.742 kg (8 lb 4 oz) M Vag-Spont Local N LIV     Birth Comments: Hypertension in pregnancy  1 Preterm 2013 [redacted]w[redacted]d    Vag-Spont   FD     Past Medical History:  Diagnosis Date  . Pregnancy induced hypertension    during delivery   History reviewed. No pertinent surgical history. Family History  Problem Relation Age of Onset  . Diabetes Mother      Exam    Uterus:   20 week  Pelvic Exam: deferred                           Bony Pelvis:    System: Breast:  normal appearance, no masses or tenderness, deferred   Skin: normal coloration and turgor, no rashes    Neurologic: oriented, normal mood   Extremities: normal strength, tone, and muscle mass   HEENT PERRLA and sclera clear, anicteric   Mouth/Teeth dental hygiene good   Neck supple   Cardiovascular: regular rate and rhythm   Respiratory:  appears well, vitals normal, no respiratory distress, acyanotic, normal RR   Abdomen: gravid and NT   Urinary:        Assessment:    Pregnancy: [id number] Patient Active Problem List   Diagnosis Date Noted  . Supervision of high risk pregnancy, antepartum 08/03/2015  . History of unexplained stillbirth 08/03/2015        Plan:     Initial labs drawn. Prenatal vitamins. Problem list  reviewed and updated. Genetic Screening discussed Quad Screen: ordered.  Ultrasound discussed; fetal survey: ordered.  Follow up in 4 weeks. 50% of 30 min visit spent on counseling and coordination of care.  Need record from [place]-ROI signed   Jon Lall 08/03/2015

## 2015-08-04 LAB — AFP, QUAD SCREEN
AFP: 55.9 ng/mL
Curr Gest Age: 20.1 weeks
Down Syndrome Scr Risk Est: 1:694 {titer}
HCG, Total: 23.19 IU/mL
INH: 444.2 pg/mL
Interpretation-AFP: NEGATIVE
MOM FOR HCG: 1.07
MoM for AFP: 0.97
MoM for INH: 2.39
Open Spina bifida: NEGATIVE
Tri 18 Scr Risk Est: NEGATIVE
UE3 VALUE: 1.43 ng/mL
uE3 Mom: 0.75

## 2015-08-08 ENCOUNTER — Ambulatory Visit (HOSPITAL_COMMUNITY)
Admission: RE | Admit: 2015-08-08 | Discharge: 2015-08-08 | Disposition: A | Discharge status: Home / Self Care | Payer: Medicaid - Out of State | Source: Ambulatory Visit | Attending: Obstetrics & Gynecology | Admitting: Obstetrics & Gynecology

## 2015-08-08 ENCOUNTER — Other Ambulatory Visit: Payer: Self-pay | Admitting: Obstetrics & Gynecology

## 2015-08-08 DIAGNOSIS — O09892 Supervision of other high risk pregnancies, second trimester: Secondary | ICD-10-CM

## 2015-08-08 DIAGNOSIS — Z1389 Encounter for screening for other disorder: Secondary | ICD-10-CM

## 2015-08-08 DIAGNOSIS — Z36 Encounter for antenatal screening of mother: Secondary | ICD-10-CM | POA: Insufficient documentation

## 2015-08-08 DIAGNOSIS — Z3A20 20 weeks gestation of pregnancy: Secondary | ICD-10-CM | POA: Insufficient documentation

## 2015-08-08 DIAGNOSIS — O09292 Supervision of pregnancy with other poor reproductive or obstetric history, second trimester: Secondary | ICD-10-CM

## 2015-08-08 DIAGNOSIS — Z3492 Encounter for supervision of normal pregnancy, unspecified, second trimester: Secondary | ICD-10-CM

## 2015-08-31 ENCOUNTER — Ambulatory Visit (INDEPENDENT_AMBULATORY_CARE_PROVIDER_SITE_OTHER): Payer: Self-pay | Admitting: Obstetrics & Gynecology

## 2015-08-31 VITALS — BP 101/63 | HR 99 | Wt 157.5 lb

## 2015-08-31 DIAGNOSIS — Z8759 Personal history of other complications of pregnancy, childbirth and the puerperium: Secondary | ICD-10-CM

## 2015-08-31 DIAGNOSIS — O0992 Supervision of high risk pregnancy, unspecified, second trimester: Secondary | ICD-10-CM

## 2015-08-31 LAB — POCT URINALYSIS DIP (DEVICE)
BILIRUBIN URINE: NEGATIVE
GLUCOSE, UA: NEGATIVE mg/dL
Hgb urine dipstick: NEGATIVE
KETONES UR: NEGATIVE mg/dL
LEUKOCYTES UA: NEGATIVE
Nitrite: NEGATIVE
Protein, ur: NEGATIVE mg/dL
SPECIFIC GRAVITY, URINE: 1.015 (ref 1.005–1.030)
Urobilinogen, UA: 0.2 mg/dL (ref 0.0–1.0)
pH: 6.5 (ref 5.0–8.0)

## 2015-08-31 NOTE — Progress Notes (Signed)
Pt c/o back pain

## 2015-08-31 NOTE — Progress Notes (Signed)
Every few days for 2 weeks, 5 min episodes of rapid HR, did not take her pulse  Subjective:  Sydney Weaver is a 20 y.o. [phone] at 1658w1d being seen today for ongoing prenatal care.  She is currently monitored for the following issues for this high-risk pregnancy and has Supervision of high risk pregnancy, antepartum and History of unexplained stillbirth on her problem list.  Patient reports low backache.  Contractions: Not present. Vag. Bleeding: None.  Movement: Present. Denies leaking of fluid.   The following portions of the patient's history were reviewed and updated as appropriate: allergies, current medications, past family history, past medical history, past social history, past surgical history and problem list. Problem list updated.  Objective:   Vitals:   08/31/15 0757  BP: 101/63  Pulse: 99  Weight: 157 lb 8 oz (71.4 kg)    Fetal Status: Fetal Heart Rate (bpm): 140   Movement: Present     General:  Alert, oriented and cooperative. Patient is in no acute distress.  Skin: Skin is warm and dry. No rash noted.   Cardiovascular: Normal heart rate noted  Respiratory: Normal respiratory effort, no problems with respiration noted  Abdomen: Soft, gravid, appropriate for gestational age. Pain/Pressure: Present     Pelvic:  low backache        Extremities: Normal range of motion.  Edema: None  Mental Status: Normal mood and affect. Normal behavior. Normal judgment and thought content.   Urinalysis:      Assessment and Plan:  Pregnancy: [id number] at 9358w1d  1. Supervision of high risk pregnancy, antepartum, second trimester Notify if episode of increased HR worsen, try to document pulse  2. History of unexplained stillbirth Nl growth on [place] 8/1, f/u in 3rd trimester  Preterm labor symptoms and general obstetric precautions including but not limited to vaginal bleeding, contractions, leaking of fluid and fetal movement were reviewed in detail with the patient. Please refer to After  Visit Summary for other counseling recommendations.  Return in about 4 weeks (around 09/28/2015).   Adam PhenixJames G Glinda Natzke, MD

## 2015-09-04 ENCOUNTER — Encounter (HOSPITAL_COMMUNITY): Payer: Self-pay | Admitting: *Deleted

## 2015-09-04 ENCOUNTER — Inpatient Hospital Stay (HOSPITAL_COMMUNITY)
Admission: AD | Admit: 2015-09-04 | Discharge: 2015-09-04 | Disposition: A | Discharge status: Home / Self Care | Payer: Medicaid - Out of State | Source: Ambulatory Visit | Attending: Family Medicine | Admitting: Family Medicine

## 2015-09-04 DIAGNOSIS — M549 Dorsalgia, unspecified: Secondary | ICD-10-CM | POA: Insufficient documentation

## 2015-09-04 DIAGNOSIS — R1030 Lower abdominal pain, unspecified: Secondary | ICD-10-CM | POA: Insufficient documentation

## 2015-09-04 DIAGNOSIS — O26892 Other specified pregnancy related conditions, second trimester: Secondary | ICD-10-CM | POA: Insufficient documentation

## 2015-09-04 DIAGNOSIS — Z3A24 24 weeks gestation of pregnancy: Secondary | ICD-10-CM | POA: Insufficient documentation

## 2015-09-04 MED ORDER — CYCLOBENZAPRINE HCL 5 MG PO TABS: 5.0000 mg | ORAL_TABLET | Freq: Three times a day (TID) | ORAL | 0 refills | Status: DC | PRN

## 2015-09-04 MED ORDER — ACETAMINOPHEN 500 MG PO TABS: 500.0000 mg | ORAL_TABLET | Freq: Four times a day (QID) | ORAL | 0 refills | Status: DC | PRN

## 2015-09-04 NOTE — MAU Provider Note (Signed)
  History     CSN: [id number]  Arrival date and time: 09/04/15 1132   None     Chief Complaint  Patient presents with  . Abdominal Pain  . Hip Pain  . Back Pain   HPI   Patient presents with c/o back pain that radiates to hips and across lower groin. Pain has been present on and off for the past 3-4 days. She has not tried anything at home for the pain. Denies contractions, LOF, vaginal bleeding, and vaginal discharge.   OB History    Gravida Para Term Preterm AB Living   4 2 1 1 1 1    SAB TAB Ectopic Multiple Live Births   1       1      Past Medical History:  Diagnosis Date  . Pregnancy induced hypertension    during delivery    History reviewed. No pertinent surgical history.  Family History  Problem Relation Age of Onset  . Diabetes Mother     Social History  Substance Use Topics  . Smoking status: Never Smoker  . Smokeless tobacco: Never Used  . Alcohol use No    Allergies: No Known Allergies  Prescriptions Prior to Admission  Medication Sig Dispense Refill Last Dose  . Prenatal Vit-Fe Fumarate-FA (PRENATAL COMPLETE) 14-0.4 MG TABS Take 1 tablet by mouth daily. 60 each 0 09/03/2015 at Unknown time  . ondansetron (ZOFRAN ODT) 4 MG disintegrating tablet Take 1 tablet (4 mg total) by mouth every 8 (eight) hours as needed for nausea or vomiting. (Patient not taking: Reported on 08/31/2015) 20 tablet 0 Not Taking at Unknown time    Review of Systems  Constitutional: Negative for fever.  Gastrointestinal: Negative for nausea and vomiting.  Genitourinary: Negative for dysuria and urgency.  Musculoskeletal: Positive for back pain.  Neurological: Negative for dizziness, focal weakness, weakness and headaches.   Physical Exam   Blood pressure 107/68, pulse 102, temperature 98.5 F (36.9 C), temperature source Oral, resp. rate 16, last menstrual period 02/20/2015.  Physical Exam  Constitutional: She is oriented to person, place, and time. She appears  well-developed and well-nourished. No distress.  HENT:  Head: Normocephalic and atraumatic.  Eyes: EOM are normal.  Neck: Normal range of motion.  Cardiovascular: Normal rate.   Respiratory: Effort normal. No respiratory distress.  GI:  Gravid uterus   Genitourinary: Vagina normal.  Musculoskeletal: She exhibits tenderness.  Paraspinal tenderness bilaterally   Neurological: She is alert and oriented to person, place, and time.  Skin: Skin is warm and dry.  Psychiatric: She has a normal mood and affect. Her behavior is normal.   Dilation: Closed Effacement (%): Thick Cervical Position: Posterior Exam by:: Dr. Earlene PlaterWallace  MAU Course  Procedures: None   MDM NST reactive without contractions on toco.   Assessment and Plan  Marvel PlanLouise Thobe is 20 y.o. [id number] at 504w5d presenting with back pain and groin pain. Likely 2/2 to round ligament pain vs. msk pain with paraspinal tenderness present on exam. Cervical exam and NST reassuring against PTL.  -Rx for Flexeril and Tylenol given  -recommended heat to back and plenty of hydration  -preterm labor precautions reviewed   De HollingsheadCatherine L Wallace 09/04/2015, 12:28 PM   OB FELLOW MAU ATTESTATION  I have seen and examined this patient and agree with above documentation in the resident's note.   Ernestina PennaNicholas Shyenne Maggard, MD 1:47 PM

## 2015-09-04 NOTE — Discharge Instructions (Signed)
Take Tylenol and Flexeril for your back pain. You can also try a heating pad to the area. Drink plenty of water to keep yourself well hydrated.

## 2015-09-04 NOTE — MAU Note (Signed)
Urine in lab 

## 2015-09-28 ENCOUNTER — Ambulatory Visit (INDEPENDENT_AMBULATORY_CARE_PROVIDER_SITE_OTHER): Payer: Medicaid - Out of State | Admitting: Obstetrics and Gynecology

## 2015-09-28 VITALS — BP 95/50 | HR 84 | Wt 159.0 lb

## 2015-09-28 DIAGNOSIS — Z8759 Personal history of other complications of pregnancy, childbirth and the puerperium: Secondary | ICD-10-CM

## 2015-09-28 DIAGNOSIS — Z23 Encounter for immunization: Secondary | ICD-10-CM

## 2015-09-28 DIAGNOSIS — O0992 Supervision of high risk pregnancy, unspecified, second trimester: Secondary | ICD-10-CM

## 2015-09-28 LAB — POCT URINALYSIS DIP (DEVICE)
Bilirubin Urine: NEGATIVE
GLUCOSE, UA: NEGATIVE mg/dL
HGB URINE DIPSTICK: NEGATIVE
Ketones, ur: NEGATIVE mg/dL
LEUKOCYTES UA: NEGATIVE
NITRITE: NEGATIVE
Protein, ur: NEGATIVE mg/dL
SPECIFIC GRAVITY, URINE: 1.02 (ref 1.005–1.030)
UROBILINOGEN UA: 0.2 mg/dL (ref 0.0–1.0)
pH: 7 (ref 5.0–8.0)

## 2015-09-28 NOTE — Addendum Note (Signed)
Addended by: Faythe CasaBELLAMY, Olan Kurek M on: 09/28/2015 09:45 AM   Modules accepted: Orders

## 2015-09-28 NOTE — Progress Notes (Signed)
28 week labs today and intital prenatal as they were never drawn.

## 2015-09-28 NOTE — Progress Notes (Signed)
Subjective:  Sydney Weaver is a 20 y.o. [id number] at 7879w1d being seen today for ongoing prenatal care.  She is currently monitored for the following issues for this high-risk pregnancy and has Supervision of high risk pregnancy, antepartum and History of unexplained stillbirth on her problem list.  Patient reports general discomforts of pregnancy.  Contractions: Not present. Vag. Bleeding: None.  Movement: Present. Denies leaking of fluid.   The following portions of the patient's history were reviewed and updated as appropriate: allergies, current medications, past family history, past medical history, past social history, past surgical history and problem list. Problem list updated.  Objective:   Vitals:   09/28/15 0822  BP: (!) 95/50  Pulse: 84  Weight: 72.1 kg (159 lb)    Fetal Status: Fetal Heart Rate (bpm): 150 Fundal Height: 28 cm Movement: Present     General:  Alert, oriented and cooperative. Patient is in no acute distress.  Skin: Skin is warm and dry. No rash noted.   Cardiovascular: Normal heart rate noted  Respiratory: Normal respiratory effort, no problems with respiration noted  Abdomen: Soft, gravid, appropriate for gestational age. Pain/Pressure: Absent     Pelvic:  Cervical exam performed        Extremities: Normal range of motion.  Edema: None  Mental Status: Normal mood and affect. Normal behavior. Normal judgment and thought content.   Urinalysis:      Assessment and Plan:  Pregnancy: [id number] at 10179w1d  1. Supervision of high risk pregnancy, antepartum, second trimester -Flu vaccine today - Glucose Tolerance, 1 HR (50g) w/o Fasting - Prenatal Profile  2. History of unexplained stillbirth -F/U U/S 34-36 weeks  Preterm labor symptoms and general obstetric precautions including but not limited to vaginal bleeding, contractions, leaking of fluid and fetal movement were reviewed in detail with the patient. Please refer to After Visit Summary for other counseling  recommendations.  Return in about 2 weeks (around 10/12/2015) for OB visit.   Hermina StaggersMichael L Akina Maish, MD

## 2015-09-29 LAB — PRENATAL PROFILE (SOLSTAS)
Antibody Screen: NEGATIVE
Basophils Absolute: 0 cells/uL (ref 0–200)
Basophils Relative: 0 %
Eosinophils Absolute: 202 cells/uL (ref 15–500)
Eosinophils Relative: 2 %
HCT: 33.3 % — ABNORMAL LOW (ref 35.0–45.0)
HIV 1&2 Ab, 4th Generation: NONREACTIVE
Hemoglobin: 11.1 g/dL — ABNORMAL LOW (ref 11.7–15.5)
Hepatitis B Surface Ag: NEGATIVE
Lymphocytes Relative: 21 %
Lymphs Abs: 2121 cells/uL (ref 850–3900)
MCH: 27.1 pg (ref 27.0–33.0)
MCHC: 33.3 g/dL (ref 32.0–36.0)
MCV: 81.2 fL (ref 80.0–100.0)
MPV: 10.5 fL (ref 7.5–12.5)
Monocytes Absolute: 505 cells/uL (ref 200–950)
Monocytes Relative: 5 %
Neutro Abs: 7272 cells/uL (ref 1500–7800)
Neutrophils Relative %: 72 %
Platelets: 177 10*3/uL (ref 140–400)
RBC: 4.1 MIL/uL (ref 3.80–5.10)
RDW: 13.9 % (ref 11.0–15.0)
Rh Type: POSITIVE
Rubella: 2.69 Index — ABNORMAL HIGH (ref <0.90)
WBC: 10.1 10*3/uL (ref 3.8–10.8)

## 2015-09-29 LAB — GLUCOSE TOLERANCE, 1 HOUR (50G) W/O FASTING: Glucose, 1 Hr, gestational: 165 mg/dL — ABNORMAL HIGH (ref <140)

## 2015-10-02 ENCOUNTER — Telehealth: Payer: Self-pay | Admitting: *Deleted

## 2015-10-02 NOTE — Telephone Encounter (Signed)
Called pt and left message on her personal voice mail stating that her 1hr glucose test was abnormal. She will need to have the 3hr test to determine if she has gestational diabetes. Please call back to schedule the test appointment. Detailed instructions will be given when she calls back.

## 2015-10-06 NOTE — Telephone Encounter (Signed)
Patient will come in on 10/18/2015

## 2015-10-18 ENCOUNTER — Ambulatory Visit (INDEPENDENT_AMBULATORY_CARE_PROVIDER_SITE_OTHER): Payer: Medicaid Other | Admitting: Obstetrics & Gynecology

## 2015-10-18 VITALS — BP 95/65 | HR 108 | Wt 159.4 lb

## 2015-10-18 DIAGNOSIS — Z23 Encounter for immunization: Secondary | ICD-10-CM

## 2015-10-18 DIAGNOSIS — O99810 Abnormal glucose complicating pregnancy: Secondary | ICD-10-CM

## 2015-10-18 DIAGNOSIS — O0990 Supervision of high risk pregnancy, unspecified, unspecified trimester: Secondary | ICD-10-CM

## 2015-10-18 DIAGNOSIS — Z8759 Personal history of other complications of pregnancy, childbirth and the puerperium: Secondary | ICD-10-CM

## 2015-10-18 NOTE — Progress Notes (Signed)
Tdap today 3 hr gtt today

## 2015-10-18 NOTE — Patient Instructions (Addendum)
Return to clinic for any scheduled appointments or obstetric concerns, or go to MAU for evaluation  Contraception Choices Contraception (birth control) is the use of any methods or devices to prevent pregnancy. Below are some methods to help avoid pregnancy. HORMONAL METHODS   Contraceptive implant. This is a thin, plastic tube containing progesterone hormone. It does not contain estrogen hormone. Your health care provider inserts the tube in the inner part of the upper arm. The tube can remain in place for up to 3 years. After 3 years, the implant must be removed. The implant prevents the ovaries from releasing an egg (ovulation), thickens the cervical mucus to prevent sperm from entering the uterus, and thins the lining of the inside of the uterus.  Progesterone-only injections. These injections are given every 3 months by your health care provider to prevent pregnancy. This synthetic progesterone hormone stops the ovaries from releasing eggs. It also thickens cervical mucus and changes the uterine lining. This makes it harder for sperm to survive in the uterus.  Birth control pills. These pills contain estrogen and progesterone hormone. They work by preventing the ovaries from releasing eggs (ovulation). They also cause the cervical mucus to thicken, preventing the sperm from entering the uterus. Birth control pills are prescribed by a health care provider.Birth control pills can also be used to treat heavy periods.  Minipill. This type of birth control pill contains only the progesterone hormone. They are taken every day of each month and must be prescribed by your health care provider.  Birth control patch. The patch contains hormones similar to those in birth control pills. It must be changed once a week and is prescribed by a health care provider.  Vaginal ring. The ring contains hormones similar to those in birth control pills. It is left in the vagina for 3 weeks, removed for 1 week, and  then a new one is put back in place. The patient must be comfortable inserting and removing the ring from the vagina.A health care provider's prescription is necessary.  Emergency contraception. Emergency contraceptives prevent pregnancy after unprotected sexual intercourse. This pill can be taken right after sex or up to 5 days after unprotected sex. It is most effective the sooner you take the pills after having sexual intercourse. Most emergency contraceptive pills are available without a prescription. Check with your pharmacist. Do not use emergency contraception as your only form of birth control. BARRIER METHODS   Female condom. This is a thin sheath (latex or rubber) that is worn over the penis during sexual intercourse. It can be used with spermicide to increase effectiveness.  Female condom. This is a soft, loose-fitting sheath that is put into the vagina before sexual intercourse.  Diaphragm. This is a soft, latex, dome-shaped barrier that must be fitted by a health care provider. It is inserted into the vagina, along with a spermicidal jelly. It is inserted before intercourse. The diaphragm should be left in the vagina for 6 to 8 hours after intercourse.  Cervical cap. This is a round, soft, latex or plastic cup that fits over the cervix and must be fitted by a health care provider. The cap can be left in place for up to 48 hours after intercourse.  Sponge. This is a soft, circular piece of polyurethane foam. The sponge has spermicide in it. It is inserted into the vagina after wetting it and before sexual intercourse.  Spermicides. These are chemicals that kill or block sperm from entering the cervix and uterus.  They come in the form of creams, jellies, suppositories, foam, or tablets. They do not require a prescription. They are inserted into the vagina with an applicator before having sexual intercourse. The process must be repeated every time you have sexual intercourse. INTRAUTERINE  CONTRACEPTION  Intrauterine device (IUD). This is a T-shaped device that is put in a woman's uterus during a menstrual period to prevent pregnancy. There are 2 types:  Copper IUD. This type of IUD is wrapped in copper wire and is placed inside the uterus. Copper makes the uterus and fallopian tubes produce a fluid that kills sperm. It can stay in place for 10 years.  Hormone IUD. This type of IUD contains the hormone progestin (synthetic progesterone). The hormone thickens the cervical mucus and prevents sperm from entering the uterus, and it also thins the uterine lining to prevent implantation of a fertilized egg. The hormone can weaken or kill the sperm that get into the uterus. It can stay in place for 3-5 years, depending on which type of IUD is used. PERMANENT METHODS OF CONTRACEPTION  Female tubal ligation. This is when the woman's fallopian tubes are surgically sealed, tied, or blocked to prevent the egg from traveling to the uterus.  Hysteroscopic sterilization. This involves placing a small coil or insert into each fallopian tube. Your doctor uses a technique called hysteroscopy to do the procedure. The device causes scar tissue to form. This results in permanent blockage of the fallopian tubes, so the sperm cannot fertilize the egg. It takes about 3 months after the procedure for the tubes to become blocked. You must use another form of birth control for these 3 months.  Female sterilization. This is when the female has the tubes that carry sperm tied off (vasectomy).This blocks sperm from entering the vagina during sexual intercourse. After the procedure, the man can still ejaculate fluid (semen). NATURAL PLANNING METHODS  Natural family planning. This is not having sexual intercourse or using a barrier method (condom, diaphragm, cervical cap) on days the woman could become pregnant.  Calendar method. This is keeping track of the length of each menstrual cycle and identifying when you are  fertile.  Ovulation method. This is avoiding sexual intercourse during ovulation.  Symptothermal method. This is avoiding sexual intercourse during ovulation, using a thermometer and ovulation symptoms.  Post-ovulation method. This is timing sexual intercourse after you have ovulated. Regardless of which type or method of contraception you choose, it is important that you use condoms to protect against the transmission of sexually transmitted infections (STIs). Talk with your health care provider about which form of contraception is most appropriate for you.   This information is not intended to replace advice given to you by your health care provider. Make sure you discuss any questions you have with your health care provider.   Document Released: 12/24/2004 Document Revised: 12/29/2012 Document Reviewed: 06/18/2012 Elsevier Interactive Patient Education 2016 [employer].   [employer] [facility]EDIAT[facility]/FAMILY PRACTICE PHYSICIANS  [facility] [address] [place], [place]  [address] Phone - [phone]   Fax - [phone]  [facility] [address] [place], [place] [address] Phone - [phone]   Fax - [phone]  [facility] [address] [place], [place]  [address] Phone - [phone]   Fax - [phone]  [facility] [address] [place], [place]  [address] Phone - [phone]   Fax - [phone]  [facility] [address] [place], [place]  [address] Phone - [phone]   Fax - [phone]  [facility] [address], [address] [place], [place]  [address] Phone - [phone]   Fax - [phone]  [facility] [address] [place], [place]  [address] Phone - [phone]   Fax - [phone]  [facility] [address] [place], [place]  [address] Phone - [phone]   Fax -  [phone]  [facility] [address] [place], [place]  [address] Phone - [phone]   Fax - [phone] [facility] [address] [place], [place]  [address] Phone - [phone]   Fax - [phone]  [facility] [address] [place], [place]  [address] Phone - [phone]   Fax - [phone]  [facility] [address], [address] [place], [place]  [address] Phone - [phone]   Fax - [phone]  [facility] [address] [place], [place]  [address] Phone - [phone]   Fax - [phone]  [facility] [address], [address] [place], [place]  [address] Phone - [phone]  [facility] [address] [place], [place]  [address] Phone - [phone]   Fax - [phone]  [facility] [address], [address] [place], [place]  [address] Phone - [phone]   Fax - [phone]  [facility] [address] [place], [place]  [address] Phone - [phone]   Fax - [phone]  [facility] [address] [place], [place]  [address] Phone - [phone]   Fax - [phone]   [facility] [address] [place], [place]  [address] Phone - [phone]   Fax - [phone]  [facility] [address] [place], [place]  [address] Phone - [phone]   Fax - [phone]  [facility] [address] [place], [place]  [address] Phone - [phone]   Fax - [phone]  [facility] [address] [place], [place]  [address] Phone - [phone]   Fax - [phone]  [facility] [address] [place], [place]  [address] Phone - [phone]   Fax - [phone] [facility] [place] [address] [place], [place]  [address] Phone - [phone]   Fax -  [phone]  [facility] [address] [place], [place]  [address] Phone - [phone]   Fax - [phone]  [facility] [place] [address] [place], [place]  [address] Phone - [phone]   Fax - [phone]

## 2015-10-18 NOTE — Progress Notes (Signed)
   PRENATAL VISIT NOTE  Subjective:  Sydney Weaver is a 20 y.o. [id number] at 2529w0d being seen today for ongoing prenatal care.  She is currently monitored for the following issues for this high-risk pregnancy and has Supervision of high risk pregnancy, antepartum and History of unexplained stillbirth on her problem list.  Patient reports no complaints.  Contractions: Not present. Vag. Bleeding: None.  Movement: Present. Denies leaking of fluid.   The following portions of the patient's history were reviewed and updated as appropriate: allergies, current medications, past family history, past medical history, past social history, past surgical history and problem list. Problem list updated.  Objective:   Vitals:   10/18/15 0837  BP: 95/65  Pulse: (!) 108  Weight: 159 lb 6.4 oz (72.3 kg)    Fetal Status: Fetal Heart Rate (bpm): 135 Fundal Height: 33 cm Movement: Present     General:  Alert, oriented and cooperative. Patient is in no acute distress.  Skin: Skin is warm and dry. No rash noted.   Cardiovascular: Normal heart rate noted  Respiratory: Normal respiratory effort, no problems with respiration noted  Abdomen: Soft, gravid, appropriate for gestational age. Pain/Pressure: Present     Pelvic:  Cervical exam deferred        Extremities: Normal range of motion.  Edema: None  Mental Status: Normal mood and affect. Normal behavior. Normal judgment and thought content.   Urinalysis:      Assessment and Plan:  Pregnancy: [id number] at 5729w0d  1. Abnormal glucose tolerance test (GTT) during pregnancy, antepartum 1 hr GTT 165.   - Glucose tolerance, 3 hours  2. History of unexplained stillbirth Occurred at 30 weeks in [place], no workup done at the time.  Normal term pregnancy in [place] afterwards. Will get growth scan and start antenatal testing at 32 weeks; BPP added to growth scan to be scheduled next week. - US MFM OB FOLLOW UP; Future - US MFM FETAL BPP WO NON STRESS;  Future  3. Need for Tdap vaccination - Tdap vaccine greater than or equal to 7yo IM  4. Supervision of high risk pregnancy, antepartum Preterm labor symptoms and general obstetric precautions including but not limited to vaginal bleeding, contractions, leaking of fluid and fetal movement were reviewed in detail with the patient. Please refer to After Visit Summary for other counseling recommendations.  Return in about 2 weeks (around 11/01/2015) for OB Visit, NST, AFI.  Tereso NewcomerUgonna A Olla Delancey, MD

## 2015-10-19 LAB — GLUCOSE TOLERANCE, 3 HOURS
GLUCOSE 3 HOUR GTT: 99 mg/dL (ref <145)
Glucose Tolerance, 1 hour: 165 mg/dL (ref <190)
Glucose Tolerance, 2 hour: 109 mg/dL (ref <165)
Glucose Tolerance, Fasting: 85 mg/dL (ref 65–104)

## 2015-10-19 NOTE — Telephone Encounter (Signed)
3hr GTT completed yesterday.

## 2015-10-25 ENCOUNTER — Encounter (HOSPITAL_COMMUNITY): Payer: Self-pay

## 2015-10-25 ENCOUNTER — Ambulatory Visit (HOSPITAL_COMMUNITY): Payer: Self-pay

## 2015-10-25 ENCOUNTER — Ambulatory Visit (HOSPITAL_COMMUNITY)
Admission: RE | Admit: 2015-10-25 | Discharge: 2015-10-25 | Disposition: A | Discharge status: Home / Self Care | Payer: Medicaid Other | Source: Ambulatory Visit | Attending: Obstetrics & Gynecology | Admitting: Obstetrics & Gynecology

## 2015-10-25 DIAGNOSIS — O09293 Supervision of pregnancy with other poor reproductive or obstetric history, third trimester: Secondary | ICD-10-CM | POA: Insufficient documentation

## 2015-10-25 DIAGNOSIS — Z3A32 32 weeks gestation of pregnancy: Secondary | ICD-10-CM | POA: Diagnosis not present

## 2015-10-25 DIAGNOSIS — Z8759 Personal history of other complications of pregnancy, childbirth and the puerperium: Secondary | ICD-10-CM

## 2015-11-01 ENCOUNTER — Ambulatory Visit (INDEPENDENT_AMBULATORY_CARE_PROVIDER_SITE_OTHER): Payer: Medicaid Other | Admitting: Advanced Practice Midwife

## 2015-11-01 VITALS — BP 107/72 | HR 105 | Wt 167.0 lb

## 2015-11-01 DIAGNOSIS — Z3689 Encounter for other specified antenatal screening: Secondary | ICD-10-CM

## 2015-11-01 DIAGNOSIS — O4703 False labor before 37 completed weeks of gestation, third trimester: Secondary | ICD-10-CM

## 2015-11-01 DIAGNOSIS — Z8759 Personal history of other complications of pregnancy, childbirth and the puerperium: Secondary | ICD-10-CM

## 2015-11-01 DIAGNOSIS — O479 False labor, unspecified: Secondary | ICD-10-CM

## 2015-11-01 DIAGNOSIS — O0990 Supervision of high risk pregnancy, unspecified, unspecified trimester: Secondary | ICD-10-CM

## 2015-11-01 NOTE — Progress Notes (Signed)
US for growth done 10/18

## 2015-11-01 NOTE — Progress Notes (Signed)
   PRENATAL VISIT NOTE  Subjective:  Sydney Weaver is a 20 y.o. [phone] at 7970w0d being seen today for ongoing prenatal care.  She is currently monitored for the following issues for this high-risk pregnancy and has Supervision of high risk pregnancy, antepartum and History of unexplained stillbirth on her problem list.  Patient reports contractions 3-4 x / day that are uncomfortable, reports she had these with her pregnancy that had a stillbirth and she is concerned..  Contractions: Irregular. Vag. Bleeding: None.  Movement: Present. Denies leaking of fluid.   The following portions of the patient's history were reviewed and updated as appropriate: allergies, current medications, past family history, past medical history, past social history, past surgical history and problem list. Problem list updated.  Objective:   Vitals:   11/01/15 0856  BP: 107/72  Pulse: (!) 105  Weight: 167 lb (75.8 kg)    Fetal Status: Fetal Heart Rate (bpm): NST-R   Movement: Present  Presentation: Transverse (head on maternal Lt, spine up)  General:  Alert, oriented and cooperative. Patient is in no acute distress.  Skin: Skin is warm and dry. No rash noted.   Cardiovascular: Normal heart rate noted  Respiratory: Normal respiratory effort, no problems with respiration noted  Abdomen: Soft, gravid, appropriate for gestational age. Pain/Pressure: Present     Pelvic:  Cervical exam performed        Extremities: Normal range of motion.  Edema: None  Mental Status: Normal mood and affect. Normal behavior. Normal judgment and thought content.   Assessment and Plan:  Pregnancy: [id number] at 5470w0d  1. History of unexplained stillbirth --NST reactive today - Amniotic fluid index with NST  2. Supervision of high risk pregnancy, antepartum   3. Braxton Hicks contractions --Cervix closed/thick/high.  Recommend increase PO fluids, reviewed preterm labor signs/reasons to come to hospital. Preterm labor symptoms  and general obstetric precautions including but not limited to vaginal bleeding, contractions, leaking of fluid and fetal movement were reviewed in detail with the patient. Please refer to After Visit Summary for other counseling recommendations.  Return in about 2 days (around 11/03/2015) for NST only.  Hurshel PartyLisa A Leftwich-Kirby, CNM

## 2015-11-03 ENCOUNTER — Ambulatory Visit (INDEPENDENT_AMBULATORY_CARE_PROVIDER_SITE_OTHER): Payer: Medicaid Other | Admitting: Family Medicine

## 2015-11-03 VITALS — BP 103/61 | HR 114

## 2015-11-03 DIAGNOSIS — O4703 False labor before 37 completed weeks of gestation, third trimester: Secondary | ICD-10-CM

## 2015-11-03 DIAGNOSIS — Z8759 Personal history of other complications of pregnancy, childbirth and the puerperium: Secondary | ICD-10-CM

## 2015-11-03 NOTE — Progress Notes (Signed)
NST reviewed. Reactive

## 2015-11-07 ENCOUNTER — Ambulatory Visit (INDEPENDENT_AMBULATORY_CARE_PROVIDER_SITE_OTHER): Payer: Medicaid Other | Admitting: Advanced Practice Midwife

## 2015-11-07 ENCOUNTER — Ambulatory Visit: Payer: Self-pay

## 2015-11-07 VITALS — BP 101/67 | HR 103 | Wt 165.1 lb

## 2015-11-07 DIAGNOSIS — O0990 Supervision of high risk pregnancy, unspecified, unspecified trimester: Secondary | ICD-10-CM

## 2015-11-07 DIAGNOSIS — Z8759 Personal history of other complications of pregnancy, childbirth and the puerperium: Secondary | ICD-10-CM | POA: Diagnosis not present

## 2015-11-07 DIAGNOSIS — Z3689 Encounter for other specified antenatal screening: Secondary | ICD-10-CM | POA: Diagnosis not present

## 2015-11-07 DIAGNOSIS — O0993 Supervision of high risk pregnancy, unspecified, third trimester: Secondary | ICD-10-CM

## 2015-11-07 DIAGNOSIS — Z3A36 36 weeks gestation of pregnancy: Secondary | ICD-10-CM

## 2015-11-07 NOTE — Progress Notes (Signed)
   PRENATAL VISIT NOTE  Subjective:  Marvel PlanLouise Blakeman is a 20 y.o. [phone] at 3320w6d being seen today for ongoing prenatal care.  She is currently monitored for the following issues for this high-risk pregnancy and has Supervision of high risk pregnancy, antepartum and History of unexplained stillbirth on her problem list.  Patient reports painless bump on right labia x 2 days.  Contractions: Irregular. Vag. Bleeding: None.  Movement: Present. Denies leaking of fluid.   The following portions of the patient's history were reviewed and updated as appropriate: allergies, current medications, past family history, past medical history, past social history, past surgical history and problem list. Problem list updated.  Objective:   Vitals:   11/07/15 0907  BP: 101/67  Pulse: (!) 103  Weight: 165 lb 1.6 oz (74.9 kg)    Fetal Status: Fetal Heart Rate (bpm): NST Fundal Height: 33 cm Movement: Present     General:  Alert, oriented and cooperative. Patient is in no acute distress.  Skin: Skin is warm and dry. No rash noted.   Cardiovascular: Normal heart rate noted  Respiratory: Normal respiratory effort, no problems with respiration noted  Abdomen: Soft, gravid, appropriate for gestational age. Pain/Pressure: Present     Pelvic:  Cervical exam performed      varicose vein on tight labia majora  Extremities: Normal range of motion.  Edema: None  Mental Status: Normal mood and affect. Normal behavior. Normal judgment and thought content.   Assessment and Plan:  Pregnancy: [id number] at 5320w6d  1. History of unexplained stillbirth  - Fetal nonstress test - [place] OB Limited - US MFM OB FOLLOW UP; Future - US MFM FETAL BPP WO NON STRESS; Future  2. Supervision of high risk pregnancy, antepartum  - US MFM OB FOLLOW UP; Future  3. [redacted] weeks gestation of pregnancy  - US MFM OB FOLLOW UP; Future  Preterm labor symptoms and general obstetric precautions including but not limited to vaginal bleeding,  contractions, leaking of fluid and fetal movement were reviewed in detail with the patient. Please refer to After Visit Summary for other counseling recommendations.  Return in about 7 days (around 11/14/2015) for NST/AFI;  11/10  NST only;  11/14  Ob fu and NST/AFI.  Dorathy KinsmanVirginia Naleyah Ohlinger, CNM

## 2015-11-07 NOTE — Progress Notes (Signed)
Pt reports having a "knot" on the Rt side of vagina 2 days ago. Pt also reports loss of appetite.

## 2015-11-07 NOTE — Patient Instructions (Addendum)
Varicose Veins °Varicose veins are veins that have become enlarged and twisted. They are usually seen in the legs but can occur in other parts of the body as well. °CAUSES °This condition is the result of valves in the veins not working properly. Valves in the veins help to return blood from the leg to the heart. If these valves are damaged, blood flows backward and backs up into the veins in the leg near the skin. This causes the veins to become larger. °RISK FACTORS °People who are on their feet a lot, who are pregnant, or who are overweight are more likely to develop varicose veins. °SIGNS AND SYMPTOMS °· Bulging, twisted-appearing, bluish veins, most commonly found on the legs. °· Leg pain or a feeling of heaviness. These symptoms may be worse at the end of the day. °· Leg swelling. °· Changes in skin color. °DIAGNOSIS °A health care provider can usually diagnose varicose veins by examining your legs. Your health care provider may also recommend an ultrasound of your leg veins. °TREATMENT °Most varicose veins can be treated at home. However, other treatments are available for people who have persistent symptoms or want to improve the cosmetic appearance of the varicose veins. These treatment options include: °· Sclerotherapy. A solution is injected into the vein to close it off. °· Laser treatment. A laser is used to heat the vein to close it off. °· Radiofrequency vein ablation. An electrical current produced by radio waves is used to close off the vein. °· Phlebectomy. The vein is surgically removed through small incisions made over the varicose vein. °· Vein ligation and stripping. The vein is surgically removed through incisions made over the varicose vein after the vein has been tied (ligated). °HOME CARE INSTRUCTIONS °· Do not stand or sit in one position for long periods of time. Do not sit with your legs crossed. Rest with your legs raised during the day. °· Wear compression stockings as directed by your  health care provider. These stockings help to prevent blood clots and reduce swelling in your legs. °· Do not wear other tight, encircling garments around your legs, pelvis, or waist. °· Walk as much as possible to increase blood flow. °· Raise the foot of your bed at night with 2-inch blocks. °· If you get a cut in the skin over the vein and the vein bleeds, lie down with your leg raised and press on it with a clean cloth until the bleeding stops. Then place a bandage (dressing) on the cut. See your health care provider if it continues to bleed. °SEEK MEDICAL CARE IF: °· The skin around your ankle starts to break down. °· You have pain, redness, tenderness, or hard swelling in your leg over a vein. °· You are uncomfortable because of leg pain. °  °This information is not intended to replace advice given to you by your health care provider. Make sure you discuss any questions you have with your health care provider. °  °Document Released: 10/03/2004 Document Revised: 01/14/2014 Document Reviewed: 05/11/2013 °Elsevier Interactive Patient Education ©2016 [employer] ° °Fetal Movement Counts °Patient Name: __________________________________________________ Patient Due Date: ____________________ °Performing a fetal movement count is highly recommended in high-risk pregnancies, but it is good for every pregnant woman to do. Your health care provider may ask you to start counting fetal movements at 28 weeks of the pregnancy. Fetal movements often increase: °· After eating a full meal. °· After physical activity. °· After eating or drinking something sweet or cold. °·   At rest. °Pay attention to when you feel the baby is most active. This will help you notice a pattern of your baby's sleep and wake cycles and what factors contribute to an increase in fetal movement. It is important to perform a fetal movement count at the same time each day when your baby is normally most active.  °HOW TO COUNT FETAL MOVEMENTS °1. Find a  quiet and comfortable area to sit or lie down on your left side. Lying on your left side provides the best blood and oxygen circulation to your baby. °2. Write down the day and time on a sheet of paper or in a journal. °3. Start counting kicks, flutters, swishes, rolls, or jabs in a 2-hour period. You should feel at least 10 movements within 2 hours. °4. If you do not feel 10 movements in 2 hours, wait 2-3 hours and count again. Look for a change in the pattern or not enough counts in 2 hours. °SEEK MEDICAL CARE IF: °· You feel less than 10 counts in 2 hours, tried twice. °· There is no movement in over an hour. °· The pattern is changing or taking longer each day to reach 10 counts in 2 hours. °· You feel the baby is not moving as he or she usually does. °Date: ____________ Movements: ____________ Start time: ____________ Finish time: ____________  °Date: ____________ Movements: ____________ Start time: ____________ Finish time: ____________ °Date: ____________ Movements: ____________ Start time: ____________ Finish time: ____________ °Date: ____________ Movements: ____________ Start time: ____________ Finish time: ____________ °Date: ____________ Movements: ____________ Start time: ____________ Finish time: ____________ °Date: ____________ Movements: ____________ Start time: ____________ Finish time: ____________ °Date: ____________ Movements: ____________ Start time: ____________ Finish time: ____________ °Date: ____________ Movements: ____________ Start time: ____________ Finish time: ____________  °Date: ____________ Movements: ____________ Start time: ____________ Finish time: ____________ °Date: ____________ Movements: ____________ Start time: ____________ Finish time: ____________ °Date: ____________ Movements: ____________ Start time: ____________ Finish time: ____________ °Date: ____________ Movements: ____________ Start time: ____________ Finish time: ____________ °Date: ____________ Movements:  ____________ Start time: ____________ Finish time: ____________ °Date: ____________ Movements: ____________ Start time: ____________ Finish time: ____________ °Date: ____________ Movements: ____________ Start time: ____________ Finish time: ____________  °Date: ____________ Movements: ____________ Start time: ____________ Finish time: ____________ °Date: ____________ Movements: ____________ Start time: ____________ Finish time: ____________ °Date: ____________ Movements: ____________ Start time: ____________ Finish time: ____________ °Date: ____________ Movements: ____________ Start time: ____________ Finish time: ____________ °Date: ____________ Movements: ____________ Start time: ____________ Finish time: ____________ °Date: ____________ Movements: ____________ Start time: ____________ Finish time: ____________ °Date: ____________ Movements: ____________ Start time: ____________ Finish time: ____________  °Date: ____________ Movements: ____________ Start time: ____________ Finish time: ____________ °Date: ____________ Movements: ____________ Start time: ____________ Finish time: ____________ °Date: ____________ Movements: ____________ Start time: ____________ Finish time: ____________ °Date: ____________ Movements: ____________ Start time: ____________ Finish time: ____________ °Date: ____________ Movements: ____________ Start time: ____________ Finish time: ____________ °Date: ____________ Movements: ____________ Start time: ____________ Finish time: ____________ °Date: ____________ Movements: ____________ Start time: ____________ Finish time: ____________  °Date: ____________ Movements: ____________ Start time: ____________ Finish time: ____________ °Date: ____________ Movements: ____________ Start time: ____________ Finish time: ____________ °Date: ____________ Movements: ____________ Start time: ____________ Finish time: ____________ °Date: ____________ Movements: ____________ Start time: ____________ Finish  time: ____________ °Date: ____________ Movements: ____________ Start time: ____________ Finish time: ____________ °Date: ____________ Movements: ____________ Start time: ____________ Finish time: ____________ °Date: ____________ Movements: ____________ Start time: ____________ Finish time: ____________  °Date: ____________ Movements: ____________ Start time: ____________ Finish time:   ____________ °Date: ____________ Movements: ____________ Start time: ____________ Finish time: ____________ °Date: ____________ Movements: ____________ Start time: ____________ Finish time: ____________ °Date: ____________ Movements: ____________ Start time: ____________ Finish time: ____________ °Date: ____________ Movements: ____________ Start time: ____________ Finish time: ____________ °Date: ____________ Movements: ____________ Start time: ____________ Finish time: ____________ °Date: ____________ Movements: ____________ Start time: ____________ Finish time: ____________  °Date: ____________ Movements: ____________ Start time: ____________ Finish time: ____________ °Date: ____________ Movements: ____________ Start time: ____________ Finish time: ____________ °Date: ____________ Movements: ____________ Start time: ____________ Finish time: ____________ °Date: ____________ Movements: ____________ Start time: ____________ Finish time: ____________ °Date: ____________ Movements: ____________ Start time: ____________ Finish time: ____________ °Date: ____________ Movements: ____________ Start time: ____________ Finish time: ____________ °Date: ____________ Movements: ____________ Start time: ____________ Finish time: ____________  °Date: ____________ Movements: ____________ Start time: ____________ Finish time: ____________ °Date: ____________ Movements: ____________ Start time: ____________ Finish time: ____________ °Date: ____________ Movements: ____________ Start time: ____________ Finish time: ____________ °Date: ____________  Movements: ____________ Start time: ____________ Finish time: ____________ °Date: ____________ Movements: ____________ Start time: ____________ Finish time: ____________ °Date: ____________ Movements: ____________ Start time: ____________ Finish time: ____________ °  °This information is not intended to replace advice given to you by your health care provider. Make sure you discuss any questions you have with your health care provider. °  °Document Released: 01/23/2006 Document Revised: 01/14/2014 Document Reviewed: 10/21/2011 °Elsevier Interactive Patient Education ©2016 [employer] ° °

## 2015-11-10 ENCOUNTER — Ambulatory Visit (INDEPENDENT_AMBULATORY_CARE_PROVIDER_SITE_OTHER): Payer: Medicaid Other | Admitting: Obstetrics and Gynecology

## 2015-11-10 VITALS — BP 109/66 | HR 109

## 2015-11-10 DIAGNOSIS — O09293 Supervision of pregnancy with other poor reproductive or obstetric history, third trimester: Secondary | ICD-10-CM

## 2015-11-10 DIAGNOSIS — Z8759 Personal history of other complications of pregnancy, childbirth and the puerperium: Secondary | ICD-10-CM

## 2015-11-10 NOTE — Progress Notes (Signed)
Reactive NST 

## 2015-11-14 ENCOUNTER — Ambulatory Visit (INDEPENDENT_AMBULATORY_CARE_PROVIDER_SITE_OTHER): Payer: Medicaid Other | Admitting: Family Medicine

## 2015-11-14 ENCOUNTER — Ambulatory Visit: Payer: Self-pay

## 2015-11-14 VITALS — BP 103/63 | HR 103

## 2015-11-14 DIAGNOSIS — Z3689 Encounter for other specified antenatal screening: Secondary | ICD-10-CM | POA: Diagnosis not present

## 2015-11-14 DIAGNOSIS — O09293 Supervision of pregnancy with other poor reproductive or obstetric history, third trimester: Secondary | ICD-10-CM

## 2015-11-14 DIAGNOSIS — Z8759 Personal history of other complications of pregnancy, childbirth and the puerperium: Secondary | ICD-10-CM

## 2015-11-14 NOTE — Progress Notes (Signed)
NST reviewed today. Receiving NSTs for previous stillbirth.  I personally reviewed the patient's NST today, found to be REACTIVE. 130 bpm, mod var, +accels, no decels. CTX: None.  Return for established follow up care.   Cleda ClarksElizabeth W. Mumaw, DO  [facility]

## 2015-11-17 ENCOUNTER — Ambulatory Visit (INDEPENDENT_AMBULATORY_CARE_PROVIDER_SITE_OTHER): Payer: Medicaid Other | Admitting: Obstetrics and Gynecology

## 2015-11-17 VITALS — BP 103/66 | HR 117

## 2015-11-17 DIAGNOSIS — O09293 Supervision of pregnancy with other poor reproductive or obstetric history, third trimester: Secondary | ICD-10-CM

## 2015-11-17 DIAGNOSIS — Z8759 Personal history of other complications of pregnancy, childbirth and the puerperium: Secondary | ICD-10-CM

## 2015-11-17 NOTE — Progress Notes (Signed)
Pt advised to return to hospital for decreased FM or sx of labor.

## 2015-11-18 ENCOUNTER — Encounter (HOSPITAL_COMMUNITY): Payer: Self-pay | Admitting: Certified Nurse Midwife

## 2015-11-18 ENCOUNTER — Inpatient Hospital Stay (HOSPITAL_COMMUNITY)
Admission: AD | Admit: 2015-11-18 | Discharge: 2015-11-18 | Disposition: A | Discharge status: Home / Self Care | Payer: Medicaid Other | Source: Ambulatory Visit | Attending: Obstetrics and Gynecology | Admitting: Obstetrics and Gynecology

## 2015-11-18 DIAGNOSIS — O26893 Other specified pregnancy related conditions, third trimester: Secondary | ICD-10-CM | POA: Diagnosis not present

## 2015-11-18 DIAGNOSIS — Z79899 Other long term (current) drug therapy: Secondary | ICD-10-CM | POA: Diagnosis not present

## 2015-11-18 DIAGNOSIS — Z3A35 35 weeks gestation of pregnancy: Secondary | ICD-10-CM | POA: Diagnosis not present

## 2015-11-18 DIAGNOSIS — O368130 Decreased fetal movements, third trimester, not applicable or unspecified: Secondary | ICD-10-CM | POA: Insufficient documentation

## 2015-11-18 NOTE — Discharge Instructions (Signed)
Fetal Movement Counts  Patient Name: __________________________________________________ Patient Due Date: ____________________  Performing a fetal movement count is highly recommended in high-risk pregnancies, but it is good for every pregnant woman to do. Your health care provider may ask you to start counting fetal movements at 28 weeks of the pregnancy. Fetal movements often increase:  · After eating a full meal.  · After physical activity.  · After eating or drinking something sweet or cold.  · At rest.  Pay attention to when you feel the baby is most active. This will help you notice a pattern of your baby's sleep and wake cycles and what factors contribute to an increase in fetal movement. It is important to perform a fetal movement count at the same time each day when your baby is normally most active.   HOW TO COUNT FETAL MOVEMENTS  1. Find a quiet and comfortable area to sit or lie down on your left side. Lying on your left side provides the best blood and oxygen circulation to your baby.  2. Write down the day and time on a sheet of paper or in a journal.  3. Start counting kicks, flutters, swishes, rolls, or jabs in a 2-hour period. You should feel at least 10 movements within 2 hours.  4. If you do not feel 10 movements in 2 hours, wait 2-3 hours and count again. Look for a change in the pattern or not enough counts in 2 hours.  SEEK MEDICAL CARE IF:  · You feel less than 10 counts in 2 hours, tried twice.  · There is no movement in over an hour.  · The pattern is changing or taking longer each day to reach 10 counts in 2 hours.  · You feel the baby is not moving as he or she usually does.  Date: ____________ Movements: ____________ Start time: ____________ Finish time: ____________   Date: ____________ Movements: ____________ Start time: ____________ Finish time: ____________  Date: ____________ Movements: ____________ Start time: ____________ Finish time: ____________  Date: ____________ Movements:  ____________ Start time: ____________ Finish time: ____________  Date: ____________ Movements: ____________ Start time: ____________ Finish time: ____________  Date: ____________ Movements: ____________ Start time: ____________ Finish time: ____________  Date: ____________ Movements: ____________ Start time: ____________ Finish time: ____________  Date: ____________ Movements: ____________ Start time: ____________ Finish time: ____________   Date: ____________ Movements: ____________ Start time: ____________ Finish time: ____________  Date: ____________ Movements: ____________ Start time: ____________ Finish time: ____________  Date: ____________ Movements: ____________ Start time: ____________ Finish time: ____________  Date: ____________ Movements: ____________ Start time: ____________ Finish time: ____________  Date: ____________ Movements: ____________ Start time: ____________ Finish time: ____________  Date: ____________ Movements: ____________ Start time: ____________ Finish time: ____________  Date: ____________ Movements: ____________ Start time: ____________ Finish time: ____________   Date: ____________ Movements: ____________ Start time: ____________ Finish time: ____________  Date: ____________ Movements: ____________ Start time: ____________ Finish time: ____________  Date: ____________ Movements: ____________ Start time: ____________ Finish time: ____________  Date: ____________ Movements: ____________ Start time: ____________ Finish time: ____________  Date: ____________ Movements: ____________ Start time: ____________ Finish time: ____________  Date: ____________ Movements: ____________ Start time: ____________ Finish time: ____________  Date: ____________ Movements: ____________ Start time: ____________ Finish time: ____________   Date: ____________ Movements: ____________ Start time: ____________ Finish time: ____________  Date: ____________ Movements: ____________ Start time: ____________ Finish  time: ____________  Date: ____________ Movements: ____________ Start time: ____________ Finish time: ____________  Date: ____________ Movements: ____________ Start time:   ____________ Finish time: ____________  Date: ____________ Movements: ____________ Start time: ____________ Finish time: ____________  Date: ____________ Movements: ____________ Start time: ____________ Finish time: ____________  Date: ____________ Movements: ____________ Start time: ____________ Finish time: ____________   Date: ____________ Movements: ____________ Start time: ____________ Finish time: ____________  Date: ____________ Movements: ____________ Start time: ____________ Finish time: ____________  Date: ____________ Movements: ____________ Start time: ____________ Finish time: ____________  Date: ____________ Movements: ____________ Start time: ____________ Finish time: ____________  Date: ____________ Movements: ____________ Start time: ____________ Finish time: ____________  Date: ____________ Movements: ____________ Start time: ____________ Finish time: ____________  Date: ____________ Movements: ____________ Start time: ____________ Finish time: ____________   Date: ____________ Movements: ____________ Start time: ____________ Finish time: ____________  Date: ____________ Movements: ____________ Start time: ____________ Finish time: ____________  Date: ____________ Movements: ____________ Start time: ____________ Finish time: ____________  Date: ____________ Movements: ____________ Start time: ____________ Finish time: ____________  Date: ____________ Movements: ____________ Start time: ____________ Finish time: ____________  Date: ____________ Movements: ____________ Start time: ____________ Finish time: ____________  Date: ____________ Movements: ____________ Start time: ____________ Finish time: ____________   Date: ____________ Movements: ____________ Start time: ____________ Finish time: ____________  Date: ____________  Movements: ____________ Start time: ____________ Finish time: ____________  Date: ____________ Movements: ____________ Start time: ____________ Finish time: ____________  Date: ____________ Movements: ____________ Start time: ____________ Finish time: ____________  Date: ____________ Movements: ____________ Start time: ____________ Finish time: ____________  Date: ____________ Movements: ____________ Start time: ____________ Finish time: ____________  Date: ____________ Movements: ____________ Start time: ____________ Finish time: ____________   Date: ____________ Movements: ____________ Start time: ____________ Finish time: ____________  Date: ____________ Movements: ____________ Start time: ____________ Finish time: ____________  Date: ____________ Movements: ____________ Start time: ____________ Finish time: ____________  Date: ____________ Movements: ____________ Start time: ____________ Finish time: ____________  Date: ____________ Movements: ____________ Start time: ____________ Finish time: ____________  Date: ____________ Movements: ____________ Start time: ____________ Finish time: ____________     This information is not intended to replace advice given to you by your health care provider. Make sure you discuss any questions you have with your health care provider.     Document Released: 01/23/2006 Document Revised: 01/14/2014 Document Reviewed: 10/21/2011  Elsevier Interactive Patient Education ©2016 [employer]

## 2015-11-18 NOTE — MAU Provider Note (Signed)
Chief Complaint:  Decreased Fetal Movement   First Provider Initiated Contact with Patient 11/18/15 1217      HPI: Sydney Weaver is a 20 y.o. [id number] at 5945w3d who presents to maternity admissions reporting decreased fetal movement.  Felt baby move yesterday and right before bedtime, felt baby move only slightly this AM once, but not again, so came in for decreased fetal movement. States when she arrived here and laid down in bed, could feel baby move again. Has not eaten anything today, only drank water. Has history of stillbirth at 30 weeks, unknown cause, back in [place]. Recent AFI in office on 11/14/15 was about 19cm.   Denies frequent contractions, only occasional. Denies leakage of fluid or vaginal bleeding. Good fetal movement now.   Past Medical History: Past Medical History:  Diagnosis Date  . Pregnancy induced hypertension    during delivery    Past obstetric history: OB History  Gravida Para Term Preterm AB Living  4 2 1 1 1 1   SAB TAB Ectopic Multiple Live Births  1       1    # Outcome Date GA Lbr Len/2nd Weight Sex Delivery Anes PTL Lv  4 Current           3 SAB 2016          2 Term 07/06/12 861w0d  8 lb 4 oz (3.742 kg) M Vag-Spont Local N LIV     Birth Comments: Hypertension in pregnancy  1 Preterm 2013 4795w0d    Vag-Spont   FD      Past Surgical History: History reviewed. No pertinent surgical history.   Family History: Family History  Problem Relation Age of Onset  . Diabetes Mother     Social History: Social History  Substance Use Topics  . Smoking status: Never Smoker  . Smokeless tobacco: Never Used  . Alcohol use No    Allergies: No Known Allergies  Meds:  Prescriptions Prior to Admission  Medication Sig Dispense Refill Last Dose  . Prenatal Vit-Fe Fumarate-FA (PRENATAL COMPLETE) 14-0.4 MG TABS Take 1 tablet by mouth daily. (Patient taking differently: Take 1 tablet by mouth daily. ) 60 each 0 Past Week at Unknown time  . acetaminophen  (TYLENOL) 500 MG tablet Take 1 tablet (500 mg total) by mouth every 6 (six) hours as needed. (Patient not taking: Reported on 11/18/2015) 30 tablet 0 Not Taking at Unknown time  . cyclobenzaprine (FLEXERIL) 5 MG tablet Take 1 tablet (5 mg total) by mouth 3 (three) times daily as needed for muscle spasms. (Patient not taking: Reported on 11/18/2015) 10 tablet 0 Not Taking at Unknown time    I have reviewed patient's Past Medical Hx, Surgical Hx, Family Hx, Social Hx, medications and allergies.   ROS:  A comprehensive ROS was negative except per HPI.    Physical Exam  Patient Vitals for the past 24 hrs:  BP Temp Temp src Pulse Resp  11/18/15 1159 105/71 97.9 F (36.6 C) Oral 107 20   Constitutional: Well-developed, well-nourished female in no acute distress.  Cardiovascular: normal rate Respiratory: normal effort GI: Abd soft, non-tender, gravid appropriate for gestational age. Pos BS x 4 MS: Extremities nontender, no edema, normal ROM Neurologic: Alert and oriented x 4.  GU: Neg CVAT. SVE: 0/thick/posterior/high  FHT:  Baseline 130 , moderate variability, accelerations present, no decelerations Contractions: q 4-5 mins , does not feel they are strong, feels them in her back   Labs: No results found for this  or any previous visit (from the past 24 hour(s)).  Imaging:  [place] Mfm Fetal Bpp Wo Non Stress  Result Date: 10/25/2015 OBSTETRICAL ULTRASOUND: This exam was performed within a [facility]. The OB US report was generated in the AS system, and faxed to the ordering physician.  This report is available in the [facility] PACS. See the AS Obstetric US report via the Image Link.  [place] Mfm Ob Follow Up  Result Date: 10/25/2015 OBSTETRICAL ULTRASOUND: This exam was performed within a  Ultrasound Department. The OB US report was generated in the AS system, and faxed to the ordering physician.  This report is available in the [facility] PACS. See the AS Obstetric  US report via the Image Link.   MAU Course: PO trial NST - REACTIVE Recent AFI on 11/14/15 was 19.6cm  I personally reviewed the patient's NST today, found to be REACTIVE. 130 bpm, mod var, +accels, no decels. CTX: q4-5 min, states are not strong, feels them in her back.   MDM: Plan of care reviewed with patient, including labs and tests ordered and medical treatment. Discussed importance of fetal kick counts, follow up for twice weekly NST with weekly AFI in office as scheduled. Has appt scheduled for 11/14 for NST and visit. Due to recent AFI being normal and reactive strip today, this modified BPP gives reassuring status of baby. Discussed return if still not feeling baby move.    Assessment: 1. Decreased fetal movement affecting management of pregnancy in third trimester, single or unspecified fetus     Plan: Discharge home in stable condition.  Preterm Labor precautions and fetal kick counts Follow up as scheduled with OB provider     Medication List    STOP taking these medications   acetaminophen 500 MG tablet Commonly known as:  TYLENOL   cyclobenzaprine 5 MG tablet Commonly known as:  FLEXERIL     TAKE these medications   PRENATAL COMPLETE 14-0.4 MG Tabs Take 1 tablet by mouth daily.       Jen MowElizabeth Marabeth Melland, DO OB Fellow 11/18/2015 12:56 PM

## 2015-11-18 NOTE — MAU Note (Signed)
Pt states she has only felt the baby move 1-2 times in the last 24 hours. Pt denies LOF or vaginal bleeding. Pt states her back is hurting but denies ctxs.

## 2015-11-21 ENCOUNTER — Ambulatory Visit (INDEPENDENT_AMBULATORY_CARE_PROVIDER_SITE_OTHER): Payer: Medicaid Other | Admitting: Advanced Practice Midwife

## 2015-11-21 ENCOUNTER — Ambulatory Visit: Payer: Self-pay

## 2015-11-21 VITALS — BP 102/61 | HR 108 | Wt 167.1 lb

## 2015-11-21 DIAGNOSIS — O09293 Supervision of pregnancy with other poor reproductive or obstetric history, third trimester: Secondary | ICD-10-CM

## 2015-11-21 DIAGNOSIS — Z8759 Personal history of other complications of pregnancy, childbirth and the puerperium: Secondary | ICD-10-CM

## 2015-11-21 DIAGNOSIS — Z3689 Encounter for other specified antenatal screening: Secondary | ICD-10-CM | POA: Diagnosis not present

## 2015-11-21 DIAGNOSIS — O0990 Supervision of high risk pregnancy, unspecified, unspecified trimester: Secondary | ICD-10-CM

## 2015-11-21 NOTE — Progress Notes (Signed)
   PRENATAL VISIT NOTE  Subjective:  Sydney Weaver is a 20 y.o. [phone] at 7419w6d being seen today for ongoing prenatal care.  She is currently monitored for the following issues for this high-risk pregnancy and has Supervision of high risk pregnancy, antepartum and History of unexplained stillbirth on her problem list.  Patient reports irregular contractions and pressure.  Contractions: Irregular. Vag. Bleeding: None.  Movement: (!) Decreased. Denies leaking of fluid.   The following portions of the patient's history were reviewed and updated as appropriate: allergies, current medications, past family history, past medical history, past social history, past surgical history and problem list. Problem list updated.  Objective:   Vitals:   11/21/15 0906  BP: 102/61  Pulse: (!) 108  Weight: 167 lb 1.6 oz (75.8 kg)    Fetal Status: Fetal Heart Rate (bpm): NST   Movement: (!) Decreased  Presentation: Transverse (head on maternal Rt, spine down)  General:  Alert, oriented and cooperative. Patient is in no acute distress.  Skin: Skin is warm and dry. No rash noted.   Cardiovascular: Normal heart rate noted  Respiratory: Normal respiratory effort, no problems with respiration noted  Abdomen: Soft, gravid, appropriate for gestational age. Pain/Pressure: Present     Pelvic:  Cervical exam deferred        Extremities: Normal range of motion.  Edema: None  Mental Status: Normal mood and affect. Normal behavior. Normal judgment and thought content.   Assessment and Plan:  Pregnancy: [id number] at 2519w6d  1. History of unexplained stillbirth  - Fetal nonstress test - [place] OB Limited  2. Supervision of high risk pregnancy, antepartum   Preterm labor symptoms and general obstetric precautions including but not limited to vaginal bleeding, contractions, leaking of fluid and fetal movement were reviewed in detail with the patient. Please refer to After Visit Summary for other counseling  recommendations.  Return in about 3 days (around 11/24/2015) for 2x/wk as scheduled.   Hurshel PartyLisa A Leftwich-Kirby, CNM

## 2015-11-21 NOTE — Progress Notes (Signed)
Pt reports decreased FM x4 days - had MAU visit on 11/11.  Very good FM during NST today - pt is aware of at least 50% of FM. US for growth and BPP scheduled 11/20.

## 2015-11-24 ENCOUNTER — Ambulatory Visit (INDEPENDENT_AMBULATORY_CARE_PROVIDER_SITE_OTHER): Payer: Medicaid Other | Admitting: *Deleted

## 2015-11-24 VITALS — BP 107/67 | HR 108

## 2015-11-24 DIAGNOSIS — Z8759 Personal history of other complications of pregnancy, childbirth and the puerperium: Secondary | ICD-10-CM

## 2015-11-24 DIAGNOSIS — O09293 Supervision of pregnancy with other poor reproductive or obstetric history, third trimester: Secondary | ICD-10-CM | POA: Diagnosis not present

## 2015-11-25 ENCOUNTER — Encounter (HOSPITAL_COMMUNITY): Payer: Self-pay | Admitting: *Deleted

## 2015-11-25 ENCOUNTER — Inpatient Hospital Stay (HOSPITAL_COMMUNITY)
Admission: AD | Admit: 2015-11-25 | Discharge: 2015-11-25 | Disposition: A | Discharge status: Home / Self Care | Payer: Medicaid Other | Source: Ambulatory Visit | Attending: Obstetrics & Gynecology | Admitting: Obstetrics & Gynecology

## 2015-11-25 DIAGNOSIS — O26893 Other specified pregnancy related conditions, third trimester: Secondary | ICD-10-CM | POA: Diagnosis not present

## 2015-11-25 DIAGNOSIS — Z3A36 36 weeks gestation of pregnancy: Secondary | ICD-10-CM | POA: Diagnosis not present

## 2015-11-25 DIAGNOSIS — M549 Dorsalgia, unspecified: Secondary | ICD-10-CM | POA: Diagnosis not present

## 2015-11-25 LAB — URINALYSIS, ROUTINE W REFLEX MICROSCOPIC
Bilirubin Urine: NEGATIVE
GLUCOSE, UA: NEGATIVE mg/dL
Hgb urine dipstick: NEGATIVE
Ketones, ur: NEGATIVE mg/dL
LEUKOCYTES UA: NEGATIVE
Nitrite: NEGATIVE
PH: 5.5 (ref 5.0–8.0)
Protein, ur: NEGATIVE mg/dL

## 2015-11-25 NOTE — MAU Note (Signed)
Onset of back pain and lower abdominal pain feels like a long contraction, denies vaginal bleeding or LOF.

## 2015-11-27 ENCOUNTER — Ambulatory Visit (HOSPITAL_COMMUNITY)
Admission: RE | Admit: 2015-11-27 | Discharge: 2015-11-27 | Disposition: A | Discharge status: Home / Self Care | Payer: Medicaid Other | Source: Ambulatory Visit | Attending: Advanced Practice Midwife | Admitting: Advanced Practice Midwife

## 2015-11-27 ENCOUNTER — Encounter (HOSPITAL_COMMUNITY): Payer: Self-pay

## 2015-11-27 ENCOUNTER — Ambulatory Visit (INDEPENDENT_AMBULATORY_CARE_PROVIDER_SITE_OTHER): Payer: Medicaid Other | Admitting: Obstetrics & Gynecology

## 2015-11-27 VITALS — BP 100/63 | HR 101 | Wt 168.1 lb

## 2015-11-27 DIAGNOSIS — O09293 Supervision of pregnancy with other poor reproductive or obstetric history, third trimester: Secondary | ICD-10-CM | POA: Insufficient documentation

## 2015-11-27 DIAGNOSIS — O0993 Supervision of high risk pregnancy, unspecified, third trimester: Secondary | ICD-10-CM | POA: Diagnosis not present

## 2015-11-27 DIAGNOSIS — Z8759 Personal history of other complications of pregnancy, childbirth and the puerperium: Secondary | ICD-10-CM | POA: Diagnosis not present

## 2015-11-27 DIAGNOSIS — Z113 Encounter for screening for infections with a predominantly sexual mode of transmission: Secondary | ICD-10-CM | POA: Diagnosis present

## 2015-11-27 DIAGNOSIS — Z3A36 36 weeks gestation of pregnancy: Secondary | ICD-10-CM | POA: Insufficient documentation

## 2015-11-27 DIAGNOSIS — O0990 Supervision of high risk pregnancy, unspecified, unspecified trimester: Secondary | ICD-10-CM

## 2015-11-27 LAB — POCT URINALYSIS DIP (DEVICE)
Bilirubin Urine: NEGATIVE
Glucose, UA: NEGATIVE mg/dL
HGB URINE DIPSTICK: NEGATIVE
Ketones, ur: NEGATIVE mg/dL
Leukocytes, UA: NEGATIVE
NITRITE: NEGATIVE
PH: 7 (ref 5.0–8.0)
PROTEIN: 30 mg/dL — AB
Specific Gravity, Urine: 1.02 (ref 1.005–1.030)
Urobilinogen, UA: 1 mg/dL (ref 0.0–1.0)

## 2015-11-27 LAB — OB RESULTS CONSOLE GC/CHLAMYDIA: Gonorrhea: NEGATIVE

## 2015-11-27 LAB — OB RESULTS CONSOLE GBS: STREP GROUP B AG: NEGATIVE

## 2015-11-27 NOTE — Progress Notes (Signed)
US for growth and BPP today @ 1015.  Pt had MAU visit on 11/18 due to hip pain and UC's    PRENATAL VISIT NOTE  Subjective:  Sydney Weaver is a 20 y.o. [id number] at 9542w5d being seen today for ongoing prenatal care.  She is currently monitored for the following issues for this high-risk pregnancy and has Supervision of high risk pregnancy, antepartum and History of unexplained stillbirth on her problem list.  Patient reports no complaints.  Contractions: Irregular. Vag. Bleeding: None.  Movement: Present. Denies leaking of fluid.   The following portions of the patient's history were reviewed and updated as appropriate: allergies, current medications, past family history, past medical history, past social history, past surgical history and problem list. Problem list updated.  Objective:   Vitals:   11/27/15 0934  BP: 100/63  Pulse: (!) 101  Weight: 168 lb 1.6 oz (76.2 kg)    Fetal Status: Fetal Heart Rate (bpm): NST   Movement: Present     General:  Alert, oriented and cooperative. Patient is in no acute distress.  Skin: Skin is warm and dry. No rash noted.   Cardiovascular: Normal heart rate noted  Respiratory: Normal respiratory effort, no problems with respiration noted  Abdomen: Soft, gravid, appropriate for gestational age. Pain/Pressure: Present     Pelvic:  Cervical exam deferred        Extremities: Normal range of motion.  Edema: None  Mental Status: Normal mood and affect. Normal behavior. Normal judgment and thought content.   Assessment and Plan:  Pregnancy: [id number] at 1842w5d  1. Supervision of high risk pregnancy, antepartum - Culture, beta strep (group b only) - GC/Chlamydia probe amp ([place]not at [facility]  2. History of unexplained stillbirth -MFM [place] today for growth - Fetal nonstress test  Term labor symptoms and general obstetric precautions including but not limited to vaginal bleeding, contractions, leaking of fluid and fetal movement were reviewed in  detail with the patient. Please refer to After Visit Summary for other counseling recommendations.  Return in about 8 days (around 12/05/2015) for change 11/29 appt to 11/28; 12/1 NST only.   Lesly DukesKelly H Dazha Kempa, MD

## 2015-11-28 LAB — GC/CHLAMYDIA PROBE AMP (~~LOC~~) NOT AT ARMC
Chlamydia: NEGATIVE
Neisseria Gonorrhea: NEGATIVE

## 2015-11-29 LAB — CULTURE, BETA STREP (GROUP B ONLY)

## 2015-12-05 ENCOUNTER — Other Ambulatory Visit: Payer: Self-pay | Admitting: Obstetrics and Gynecology

## 2015-12-06 ENCOUNTER — Other Ambulatory Visit: Payer: Self-pay | Admitting: Family Medicine

## 2015-12-08 ENCOUNTER — Ambulatory Visit (INDEPENDENT_AMBULATORY_CARE_PROVIDER_SITE_OTHER): Payer: Medicaid Other | Admitting: Obstetrics & Gynecology

## 2015-12-08 ENCOUNTER — Ambulatory Visit: Payer: Self-pay

## 2015-12-08 VITALS — BP 103/63 | HR 105

## 2015-12-08 DIAGNOSIS — O09293 Supervision of pregnancy with other poor reproductive or obstetric history, third trimester: Secondary | ICD-10-CM

## 2015-12-08 DIAGNOSIS — Z8759 Personal history of other complications of pregnancy, childbirth and the puerperium: Secondary | ICD-10-CM

## 2015-12-08 DIAGNOSIS — O0990 Supervision of high risk pregnancy, unspecified, unspecified trimester: Secondary | ICD-10-CM

## 2015-12-08 DIAGNOSIS — Z3689 Encounter for other specified antenatal screening: Secondary | ICD-10-CM

## 2015-12-08 NOTE — Progress Notes (Addendum)
Pt informed that the ultrasound is considered a limited OB ultrasound and is not intended to be a complete ultrasound exam.  Patient also informed that the ultrasound is not being completed with the intent of assessing for fetal or placental anomalies or any pelvic abnormalities.  Explained that the purpose of today's ultrasound is to assess for presentation and amniotic fluid volume.  Patient acknowledges the purpose of the exam and the limitations of the study.    IOL scheduled 12/8 @ 0730

## 2015-12-08 NOTE — Progress Notes (Signed)
   PRENATAL VISIT NOTE  Subjective:  Sydney Weaver is a 20 y.o. [phone] at 7268w2d being seen today for ongoing prenatal care.  She is currently monitored for the following issues for this high-risk pregnancy and has Supervision of high risk pregnancy, antepartum and History of unexplained stillbirth on her problem list.  Patient reports no complaints.  Contractions: Irregular. Vag. Bleeding: None.  Movement: Present. Denies leaking of fluid.   The following portions of the patient's history were reviewed and updated as appropriate: allergies, current medications, past family history, past medical history, past social history, past surgical history and problem list. Problem list updated.  Objective:   Vitals:   12/08/15 0923  BP: 103/63  Pulse: (!) 105    Fetal Status: Fetal Heart Rate (bpm): NST Fundal Height: 39 cm Movement: Present  Presentation: Vertex  General:  Alert, oriented and cooperative. Patient is in no acute distress.  Skin: Skin is warm and dry. No rash noted.   Cardiovascular: Normal heart rate noted  Respiratory: Normal respiratory effort, no problems with respiration noted  Abdomen: Soft, gravid, appropriate for gestational age. Pain/Pressure: Present     Pelvic:  Cervical exam deferred        Extremities: Normal range of motion.  Edema: None  Mental Status: Normal mood and affect. Normal behavior. Normal judgment and thought content.   NST performed today was reviewed and was found to be reactive.  AFI was also normal.  Continue recommended antenatal testing and prenatal care.  Assessment and Plan:  Pregnancy: [id number] at 1368w2d  1. History of unexplained stillbirth IOL scheduled at 39 weeks.  - Fetal nonstress test - [place] OB Limited  2. Supervision of high risk pregnancy, antepartum Term labor symptoms and general obstetric precautions including but not limited to vaginal bleeding, contractions, leaking of fluid and fetal movement were reviewed in detail with the  patient. Please refer to After Visit Summary for other counseling recommendations.  Return in about 4 days (around 12/12/2015) for NST only.   Tereso NewcomerUgonna A Rhea Kaelin, MD

## 2015-12-08 NOTE — Patient Instructions (Signed)
Return to clinic for any scheduled appointments or obstetric concerns, or go to MAU for evaluation  

## 2015-12-11 ENCOUNTER — Telehealth (HOSPITAL_COMMUNITY): Payer: Self-pay | Admitting: *Deleted

## 2015-12-11 ENCOUNTER — Encounter (HOSPITAL_COMMUNITY): Payer: Self-pay | Admitting: *Deleted

## 2015-12-11 NOTE — Telephone Encounter (Signed)
Preadmission screen  

## 2015-12-12 ENCOUNTER — Ambulatory Visit (INDEPENDENT_AMBULATORY_CARE_PROVIDER_SITE_OTHER): Payer: Medicaid Other | Admitting: Student

## 2015-12-12 VITALS — BP 103/68 | HR 102

## 2015-12-12 DIAGNOSIS — Z8759 Personal history of other complications of pregnancy, childbirth and the puerperium: Secondary | ICD-10-CM

## 2015-12-12 DIAGNOSIS — O09293 Supervision of pregnancy with other poor reproductive or obstetric history, third trimester: Secondary | ICD-10-CM

## 2015-12-12 NOTE — Progress Notes (Signed)
NST reactive with FHR baseline of 130 with moderate variability, present accelerations and negative decelerations.

## 2015-12-12 NOTE — Progress Notes (Signed)
Pt reports decreased FM x2 days.  She felt good FM during NST.  Pt advised to return to hospital for decreased FM or sx of labor and voiced understanding.  IOL scheduled 12/8.

## 2015-12-15 ENCOUNTER — Encounter (HOSPITAL_COMMUNITY): Payer: Self-pay

## 2015-12-15 ENCOUNTER — Inpatient Hospital Stay (HOSPITAL_COMMUNITY)
Admission: RE | Admit: 2015-12-15 | Discharge: 2015-12-20 | DRG: 775 | Disposition: A | Discharge status: Home / Self Care | Payer: Medicaid Other | Source: Ambulatory Visit | Attending: Obstetrics & Gynecology | Admitting: Obstetrics & Gynecology

## 2015-12-15 DIAGNOSIS — O26893 Other specified pregnancy related conditions, third trimester: Secondary | ICD-10-CM | POA: Diagnosis present

## 2015-12-15 DIAGNOSIS — Z833 Family history of diabetes mellitus: Secondary | ICD-10-CM | POA: Diagnosis not present

## 2015-12-15 DIAGNOSIS — O368130 Decreased fetal movements, third trimester, not applicable or unspecified: Secondary | ICD-10-CM | POA: Diagnosis present

## 2015-12-15 DIAGNOSIS — Z3A39 39 weeks gestation of pregnancy: Secondary | ICD-10-CM | POA: Diagnosis not present

## 2015-12-15 LAB — CBC
HCT: 29.7 % — ABNORMAL LOW (ref 36.0–46.0)
HEMOGLOBIN: 9.9 g/dL — AB (ref 12.0–15.0)
MCH: 25.3 pg — ABNORMAL LOW (ref 26.0–34.0)
MCHC: 33.3 g/dL (ref 30.0–36.0)
MCV: 75.8 fL — ABNORMAL LOW (ref 78.0–100.0)
Platelets: 222 10*3/uL (ref 150–400)
RBC: 3.92 MIL/uL (ref 3.87–5.11)
RDW: 14.6 % (ref 11.5–15.5)
WBC: 10.9 10*3/uL — AB (ref 4.0–10.5)

## 2015-12-15 LAB — RPR: RPR: NONREACTIVE

## 2015-12-15 LAB — TYPE AND SCREEN
ABO/RH(D): O POS
Antibody Screen: NEGATIVE

## 2015-12-15 LAB — ABO/RH: ABO/RH(D): O POS

## 2015-12-15 MED ORDER — OXYCODONE-ACETAMINOPHEN 5-325 MG PO TABS: 1.0000 | ORAL_TABLET | ORAL | Status: DC | PRN

## 2015-12-15 MED ORDER — SOD CITRATE-CITRIC ACID 500-334 MG/5ML PO SOLN: 30.0000 mL | ORAL | Status: DC | PRN

## 2015-12-15 MED ORDER — OXYCODONE-ACETAMINOPHEN 5-325 MG PO TABS: 2.0000 | ORAL_TABLET | ORAL | Status: DC | PRN

## 2015-12-15 MED ORDER — ONDANSETRON HCL 4 MG/2ML IJ SOLN
4.0000 mg | Freq: Four times a day (QID) | INTRAMUSCULAR | Status: DC | PRN
  Administered 2015-12-17: 4 mg via INTRAVENOUS
  Filled 2015-12-15: qty 2

## 2015-12-15 MED ORDER — LACTATED RINGERS IV SOLN
INTRAVENOUS | Status: DC
  Administered 2015-12-15 – 2015-12-18 (×7): via INTRAVENOUS

## 2015-12-15 MED ORDER — LIDOCAINE HCL (PF) 1 % IJ SOLN
30.0000 mL | INTRAMUSCULAR | Status: DC | PRN
  Filled 2015-12-15: qty 30

## 2015-12-15 MED ORDER — OXYTOCIN 40 UNITS IN LACTATED RINGERS INFUSION - SIMPLE MED
2.5000 [IU]/h | INTRAVENOUS | Status: DC
  Administered 2015-12-18: 2.5 [IU]/h via INTRAVENOUS
  Filled 2015-12-15 (×2): qty 1000

## 2015-12-15 MED ORDER — OXYTOCIN BOLUS FROM INFUSION
500.0000 mL | Freq: Once | INTRAVENOUS | Status: AC
  Administered 2015-12-18: 500 mL via INTRAVENOUS

## 2015-12-15 MED ORDER — MISOPROSTOL 25 MCG QUARTER TABLET
25.0000 ug | ORAL_TABLET | ORAL | Status: DC | PRN
  Administered 2015-12-15 – 2015-12-16 (×3): 25 ug via VAGINAL
  Filled 2015-12-15: qty 0.25
  Filled 2015-12-15: qty 1
  Filled 2015-12-15 (×2): qty 0.25

## 2015-12-15 MED ORDER — LACTATED RINGERS IV SOLN
500.0000 mL | INTRAVENOUS | Status: DC | PRN
  Administered 2015-12-17: 500 mL via INTRAVENOUS

## 2015-12-15 MED ORDER — TERBUTALINE SULFATE 1 MG/ML IJ SOLN
0.2500 mg | Freq: Once | INTRAMUSCULAR | Status: DC | PRN
  Filled 2015-12-15: qty 1

## 2015-12-15 MED ORDER — ACETAMINOPHEN 325 MG PO TABS: 650.0000 mg | ORAL_TABLET | ORAL | Status: DC | PRN

## 2015-12-15 NOTE — Anesthesia Pain Management Evaluation Note (Signed)
  CRNA Pain Management Visit Note  Patient: Sydney Weaver, 20 y.o., female  "Hello I am a member of the anesthesia team at [facility]. We have an anesthesia team available at all times to provide care throughout the hospital, including epidural management and anesthesia for C-section. I don't know your plan for the delivery whether it a natural birth, water birth, IV sedation, nitrous supplementation, doula or epidural, but we want to meet your pain goals."   1.Was your pain managed to your expectations on prior hospitalizations?   No prior hospitalizations  2.What is your expectation for pain management during this hospitalization?     Labor support without medications  3.How can we help you reach that goal? natural  Record the patient's initial score and the patient's pain goal.   Pain: 7  Pain Goal: 10 [facility] wants you to be able to say your pain was always managed very well.  Edison PaceWILKERSON,Agapito Hanway 12/15/2015

## 2015-12-15 NOTE — Progress Notes (Signed)
Labor Progress Note Marvel PlanLouise Tawney is a 20 y.o. [phone] [id number] at 4088w2d presented for IOL  S:  Comfortable, feeling some ctx.   O:  BP 112/84   Pulse 92   Temp 98.4 F (36.9 C) (Oral)   Resp 16   Ht 5\' 4"  (1.626 m)   Wt 76.2 kg (168 lb)   LMP 02/20/2015 (Approximate)   BMI 28.84 kg/m  EFM: baseline 135 bpm/ mod variability/ + accels/ no decels  Toco: 3-5 SVE: Dilation: 1 Effacement (%): 60 Station: -3 Presentation: Vertex (US confirmation) Exam by:: Jaslene Marsteller   A/P: 20 y.o. [id number] 2488w2d  1. Labor: IOL 2. FWB: Cat I 3. Pain: analgesia prn Unsuccessful attempt to place foley. Continue Cytotec. Will attempt again later.  Donette LarryMelanie Gifford Ballon, CNM 6:57 PM

## 2015-12-15 NOTE — Progress Notes (Signed)
2nd call to MD regarding orders for IOL. Faculty physicians were in a delivery and will arrive shortly. Erikah Thumm [place] Adonai Helzer, [place] 12/15/2015 1025

## 2015-12-15 NOTE — Progress Notes (Signed)
Patient ID: Sydney Weaver, female   DOB: 1995-05-21, 20 y.o.   MRN: [id number] LABOR PROGRESS NOTE  Sydney Weaver is a 20 y.o. [id number] at 7740w2d  admitted for IOL due to hx of IUFD @30  weeks and decreased FM  Subjective: Patient comfortable, feeling stronger contractions. S/p 1 dose of cytotec  Objective: BP 109/82   Pulse 85   Temp 98.4 F (36.9 C) (Oral)   Resp 18   Ht 5\' 4"  (1.626 m)   Wt 168 lb (76.2 kg)   LMP 02/20/2015 (Approximate)   BMI 28.84 kg/m  or  Vitals:   12/15/15 0805 12/15/15 1230  BP: 111/75 109/82  Pulse: (!) 112 85  Resp: 18   Temp: 97.6 F (36.4 C) 98.4 F (36.9 C)  TempSrc: Oral Oral  Weight: 168 lb (76.2 kg)   Height: 5\' 4"  (1.626 m)      Dilation: Fingertip Effacement (%): 30 Station: Ballotable Presentation: Vertex (US confirmation) Exam by:: bhambri  Labs: Lab Results  Component Value Date   WBC 10.9 (H) 12/15/2015   HGB 9.9 (L) 12/15/2015   HCT 29.7 (L) 12/15/2015   MCV 75.8 (L) 12/15/2015   PLT 222 12/15/2015    Patient Active Problem List   Diagnosis Date Noted  . Labor and delivery, indication for care 12/15/2015  . Supervision of high risk pregnancy, antepartum 08/03/2015  . History of unexplained stillbirth 08/03/2015    Assessment / Plan: 20 y.o. [id number] at 5440w2d here for IOL due to hx of IUFD @30  weeks and decreased FM  Labor: s/p cytotec x1. Will recheck cervix soon, and consider foley bulb placement Fetal Wellbeing:  Cat I Pain Control:  Analgesia PRN Anticipated MOD:  NSVD  Clearance CootsAndrew Raza Bayless, MD 12/15/2015, 4:19 PM

## 2015-12-15 NOTE — H&P (Signed)
LABOR AND DELIVERY ADMISSION HISTORY AND PHYSICAL NOTE  Sydney Weaver is a 20 y.o. female [phone] with IUP at 2223w2d  presenting for scheduled IOL due to hx of unexplained stillbirth and recent complaint of decreased FM. Reactive NST. She reports positive fetal movement today. She denies leakage of fluid or vaginal bleeding.  Clinic  [facility] Prenatal Labs  Dating 05/03/15 7 week US Blood type: O/POS/-- (09/21 0941)   Genetic Screen  Quad: normal      Antibody:NEG (09/21 0941)  Anatomic US normal Rubella: 2.69 (09/21 0941)  GTT Third trimester: 165 , normal 3 hr GTT RPR: NON REAC (09/21 0941)   Flu vaccine 09/28/15 HBsAg: NEGATIVE (09/21 0941)   TDaP vaccine 10/18/15                                   HIV: NONREACTIVE (09/21 0941)   Baby Food Breast                               GBS: neg  Contraception Depo Provera Pap:  Circumcision Yes   Pediatrician Given list   Support Person  Husband     Prenatal History/Complications:  Past Medical History: Past Medical History:  Diagnosis Date  . Pregnancy induced hypertension    during delivery    Past Surgical History: History reviewed. No pertinent surgical history.  Obstetrical History: OB History    Gravida Para Term Preterm AB Living   4 2 1 1 1 1    SAB TAB Ectopic Multiple Live Births   1       1      Social History: Social History   Social History  . Marital status: Single    Spouse name: N/A  . Number of children: N/A  . Years of education: N/A   Social History Main Topics  . Smoking status: Never Smoker  . Smokeless tobacco: Never Used  . Alcohol use No  . Drug use: No  . Sexual activity: Yes    Birth control/ protection: None   Other Topics Concern  . None   Social History Narrative  . None    Family History: Family History  Problem Relation Age of Onset  . Diabetes Mother   . Diabetes Maternal Uncle     Allergies: No Known Allergies  Prescriptions Prior to Admission  Medication Sig Dispense Refill  Last Dose  . Prenatal Vit-Fe Fumarate-FA (PRENATAL COMPLETE) 14-0.4 MG TABS Take 1 tablet by mouth daily. (Patient taking differently: Take 1 tablet by mouth daily. ) 60 each 0 Taking     Review of Systems   All systems reviewed and negative except as stated in HPI  Blood pressure 111/75, pulse (!) 112, temperature 97.6 F (36.4 C), temperature source Oral, resp. rate 18, height 5\' 4"  (1.626 m), weight 168 lb (76.2 kg), last menstrual period 02/20/2015. General appearance: alert and cooperative Lungs: clear to auscultation bilaterally Heart: regular rate and rhythm Abdomen: soft, non-tender; bowel sounds normal Extremities: No calf swelling or tenderness Presentation: cephalic Fetal monitoring: Cat. I. 135, moderate variability, no decels.  Uterine activity: occasional  Dilation: Fingertip Effacement (%): 30 Station: Ballotable Exam by:: middleton rn   Prenatal labs: ABO, Rh: --/--/O POS (12/08 0815) Antibody: NEG (12/08 0815) Rubella: !Error! RPR: NON REAC (09/21 0941)  HBsAg: NEGATIVE (09/21 0941)  HIV: NONREACTIVE (09/21 0941)  GBS:  negative 1 hr Glucola: wnl at 165 Genetic screening:  Quad normal Anatomy US: normal  Prenatal Transfer Tool  Maternal Diabetes: No Genetic Screening: Normal Maternal Ultrasounds/Referrals: Normal Fetal Ultrasounds or other Referrals:  None Maternal Substance Abuse:  No Significant Maternal Medications:  None Significant Maternal Lab Results: None  Results for orders placed or performed during the hospital encounter of 12/15/15 (from the past 24 hour(s))  CBC   Collection Time: 12/15/15  8:15 AM  Result Value Ref Range   WBC 10.9 (H) 4.0 - 10.5 K/uL   RBC 3.92 3.87 - 5.11 MIL/uL   Hemoglobin 9.9 (L) 12.0 - 15.0 g/dL   HCT 16.129.7 (L) 09.636.0 - 04.546.0 %   MCV 75.8 (L) 78.0 - 100.0 fL   MCH 25.3 (L) 26.0 - 34.0 pg   MCHC 33.3 30.0 - 36.0 g/dL   RDW 40.914.6 81.111.5 - 91.415.5 %   Platelets 222 150 - 400 K/uL  Type and screen [facility]  [place]   Collection Time: 12/15/15  8:15 AM  Result Value Ref Range   ABO/RH(D) O POS    Antibody Screen NEG    Sample Expiration 12/18/2015     Patient Active Problem List   Diagnosis Date Noted  . Labor and delivery, indication for care 12/15/2015  . Supervision of high risk pregnancy, antepartum 08/03/2015  . History of unexplained stillbirth 08/03/2015    Assessment: Sydney [name] is a 20 y.o. [phone] at 2278w2d here for IOL due to previous IUFD at 30 wks  #labor: augmentation with cytotec-->foley bulb. Anticipate NSVD #Pain: Planning for IV pain meds #FWB: Cat. I #ID:  GBS neg #MOF: Both #MOC:depo #Circ:  outpatient  Clearance Cootsndrew Tyson 12/15/2015, 11:32 AM  Midwife attestation: I have seen and examined this patient; I agree with above documentation in the resident's note.   Sydney Weaver is a 20 y.o. [phone] here for IOL for hs IUFD @30  weeks and decreased FM.  PE: Gen: calm comfortable, NAD Resp: normal effort, no distress Abd: gravid  ROS, labs, PMH reviewed  Assessment/Plan: Admit to LD Labor: IOL FWB: Cat I ID: GBS neg Cytotec then foley  Donette LarryMelanie Gracy Ehly, CNM  12/15/2015, 2:46 PM

## 2015-12-16 DIAGNOSIS — O368130 Decreased fetal movements, third trimester, not applicable or unspecified: Secondary | ICD-10-CM

## 2015-12-16 DIAGNOSIS — Z3A39 39 weeks gestation of pregnancy: Secondary | ICD-10-CM

## 2015-12-16 MED ORDER — EPHEDRINE 5 MG/ML INJ
10.0000 mg | INTRAVENOUS | Status: DC | PRN
  Filled 2015-12-16: qty 4

## 2015-12-16 MED ORDER — FENTANYL CITRATE (PF) 100 MCG/2ML IJ SOLN
50.0000 ug | INTRAMUSCULAR | Status: DC | PRN
  Administered 2015-12-16 (×2): 100 ug via INTRAVENOUS
  Filled 2015-12-16 (×2): qty 2

## 2015-12-16 MED ORDER — FENTANYL 2.5 MCG/ML BUPIVACAINE 1/10 % EPIDURAL INFUSION (WH - ANES)
14.0000 mL/h | INTRAMUSCULAR | Status: DC | PRN
  Administered 2015-12-17 – 2015-12-18 (×4): 14 mL/h via EPIDURAL
  Filled 2015-12-16 (×4): qty 100

## 2015-12-16 MED ORDER — OXYTOCIN 40 UNITS IN LACTATED RINGERS INFUSION - SIMPLE MED
1.0000 m[IU]/min | INTRAVENOUS | Status: DC
  Administered 2015-12-16: 2 m[IU]/min via INTRAVENOUS

## 2015-12-16 MED ORDER — DIPHENHYDRAMINE HCL 50 MG/ML IJ SOLN: 12.5000 mg | INTRAMUSCULAR | Status: DC | PRN

## 2015-12-16 MED ORDER — TERBUTALINE SULFATE 1 MG/ML IJ SOLN
0.2500 mg | Freq: Once | INTRAMUSCULAR | Status: DC | PRN
  Filled 2015-12-16: qty 1

## 2015-12-16 MED ORDER — PHENYLEPHRINE 40 MCG/ML (10ML) SYRINGE FOR IV PUSH (FOR BLOOD PRESSURE SUPPORT)
80.0000 ug | PREFILLED_SYRINGE | INTRAVENOUS | Status: DC | PRN
Start: 2015-12-16 — End: 2015-12-18
  Administered 2015-12-17 (×2): 40 ug via INTRAVENOUS
  Filled 2015-12-16: qty 5

## 2015-12-16 MED ORDER — LACTATED RINGERS IV SOLN
500.0000 mL | Freq: Once | INTRAVENOUS | Status: AC
  Administered 2015-12-16: 500 mL via INTRAVENOUS

## 2015-12-16 MED ORDER — PHENYLEPHRINE 40 MCG/ML (10ML) SYRINGE FOR IV PUSH (FOR BLOOD PRESSURE SUPPORT)
80.0000 ug | PREFILLED_SYRINGE | INTRAVENOUS | Status: DC | PRN
  Filled 2015-12-16: qty 10
  Filled 2015-12-16: qty 5

## 2015-12-16 NOTE — Progress Notes (Addendum)
Patient ID: [name], female   DOB: November 01, 1995, 20 y.o.   MRN: [id number] Doing well  FHR stable UCS irregular  Vitals:   12/15/15 2159 12/15/15 2200 12/16/15 0651 12/16/15 0956  BP:  111/67 104/76 101/69  Pulse:  90 74 81  Resp: 20 20 18 20   Temp:   97.7 F (36.5 C) 97.8 F (36.6 C)  TempSrc:   Oral Oral  Weight:      Height:       Foley placed  Dilation: 2 Effacement (%): 50 Cervical Position: Posterior Station: -3 Presentation: Vertex Exam by:: Sydney BourgeoisMarie Jalena Weaver, CNM  Will add Pitocin later if needed

## 2015-12-16 NOTE — Progress Notes (Signed)
Sydney Weaver is a 20 y.o. [phone] at 5734w3d by  Marvel Planadmitted for IOL due to decreased FM and Hx of IUFD at 30 weeks Subjective: Resting in bed with eyes closed.   Objective: BP 111/67   Pulse 90   Temp 98.3 F (36.8 C) (Oral)   Resp 20   Ht 5\' 4"  (1.626 m)   Wt 168 lb (76.2 kg)   LMP 02/20/2015 (Approximate)   BMI 28.84 kg/m  No intake/output data recorded. No intake/output data recorded.  FHT:  FHR: 130 bpm, variability: moderate,  accelerations:  Present,  decelerations:  Absent UC:   irregular, every 3-5 in 10 minutes minutes SVE:   Dilation: 1.5 Effacement (%): 60 Station: -3 Exam by:: Sydney SpillersGigi Whitfield, RN  Labs: Lab Results  Component Value Date   WBC 10.9 (H) 12/15/2015   HGB 9.9 (L) 12/15/2015   HCT 29.7 (L) 12/15/2015   MCV 75.8 (L) 12/15/2015   PLT 222 12/15/2015    Assessment / Plan: Patient status post 2 doses of cytotec vaginall.  Labor: Progressing normally Preeclampsia:  none Fetal Wellbeing:  Category I Pain Control:   I/D:  n/a Anticipated MOD:  NSVD  Consider foley bulb placement or start pitocin after next cervical check at 2 am.   Sydney Weaver CNM 12/16/2015, 2:19 AM

## 2015-12-16 NOTE — Progress Notes (Signed)
Attempted Foley bulb unsuccessfully; will continue with cytotec.

## 2015-12-17 ENCOUNTER — Encounter (HOSPITAL_COMMUNITY): Payer: Self-pay

## 2015-12-17 ENCOUNTER — Inpatient Hospital Stay (HOSPITAL_COMMUNITY): Payer: Medicaid Other | Admitting: Anesthesiology

## 2015-12-17 MED ORDER — OXYTOCIN 40 UNITS IN LACTATED RINGERS INFUSION - SIMPLE MED: 1.0000 m[IU]/min | INTRAVENOUS | Status: DC

## 2015-12-17 MED ORDER — LACTATED RINGERS IV SOLN
INTRAVENOUS | Status: DC
  Administered 2015-12-17: 23:00:00 via INTRAUTERINE

## 2015-12-17 MED ORDER — LIDOCAINE HCL (PF) 1 % IJ SOLN
INTRAMUSCULAR | Status: DC | PRN
  Administered 2015-12-17: 6 mL via EPIDURAL
  Administered 2015-12-17: 7 mL via EPIDURAL

## 2015-12-17 NOTE — Anesthesia Procedure Notes (Signed)
Epidural Patient location during procedure: OB Start time: 12/17/2015 12:22 AM End time: 12/17/2015 12:24 AM  Staffing Anesthesiologist: Leilani AbleHATCHETT, Karlin Heilman Performed: anesthesiologist   Preanesthetic Checklist Completed: patient identified, surgical consent, pre-op evaluation, timeout performed, IV checked, risks and benefits discussed and monitors and equipment checked  Epidural Patient position: sitting Prep: site prepped and draped and DuraPrep Patient monitoring: continuous pulse ox and blood pressure Approach: midline Location: L3-L4 Injection technique: LOR air  Needle:  Needle type: Tuohy  Needle gauge: 17 G Needle length: 9 cm and 9 Needle insertion depth: 5 cm cm Catheter type: closed end flexible Catheter size: 19 Gauge Catheter at skin depth: 10 cm Test dose: negative and Other  Assessment Sensory level: T9 Events: blood not aspirated, injection not painful, no injection resistance, negative IV test and no paresthesia  Additional Notes Reason for block:procedure for pain

## 2015-12-17 NOTE — Progress Notes (Signed)
[name] is a 20 y.o. [phone] at 558w4d by ultrasound admitted for induction of labor due to prev stillbirth.  Subjective:   Objective: BP 108/76   Pulse 73   Temp 98.4 F (36.9 C) (Oral)   Resp 20   Ht 5\' 4"  (1.626 m)   Wt 168 lb (76.2 kg)   LMP 02/20/2015 (Approximate)   SpO2 100%   BMI 28.84 kg/m  I/O last 3 completed shifts: In: -  Out: 1550 [Urine:1550] No intake/output data recorded.  FHT:  FHR: 120's bpm, variability: moderate,  accelerations:  Present,  decelerations:  Absent UC:   irregular, every 5-7 minutes SVE:   Dilation: 5.5 Effacement (%): 90 Station: -2 Exam by:: Camelia EngK. Haynes, RN  Labs: Lab Results  Component Value Date   WBC 10.9 (H) 12/15/2015   HGB 9.9 (L) 12/15/2015   HCT 29.7 (L) 12/15/2015   MCV 75.8 (L) 12/15/2015   PLT 222 12/15/2015    Assessment / Plan: Induction of labor due to prev stillbirth,  progressing well on pitocin  Labor: slow progress Preeclampsia:  no signs or symptoms of toxicity Fetal Wellbeing:  Category I Pain Control:  Epidural I/D:  n/a Anticipated MOD:  NSVD  Sydney Weaver 12/17/2015, 2:45 PM

## 2015-12-17 NOTE — Progress Notes (Signed)
[name] is a 20 y.o. [phone] at 2626w4d by ultrasound admitted for induction of labor due to prev fetal loss.  Subjective:   Objective: BP 114/78   Pulse 78   Temp 98.3 F (36.8 C) (Oral)   Resp 18   Ht 5\' 4"  (1.626 m)   Wt 168 lb (76.2 kg)   LMP 02/20/2015 (Approximate)   SpO2 100%   BMI 28.84 kg/m  I/O last 3 completed shifts: In: -  Out: 1550 [Urine:1550] No intake/output data recorded.  FHT:  FHR: 130's bpm, variability: moderate,  accelerations:  Present,  decelerations:  Absent UC:   regular, every 3-4 minutes SVE:   Dilation: 5.5 Effacement (%): 90 Station: -2 Exam by:: Camelia EngK. Haynes, RN  Labs: Lab Results  Component Value Date   WBC 10.9 (H) 12/15/2015   HGB 9.9 (L) 12/15/2015   HCT 29.7 (L) 12/15/2015   MCV 75.8 (L) 12/15/2015   PLT 222 12/15/2015    Assessment / Plan: Induction of labor due to prev fetal loss,  progressing well on pitocin  Labor: slow progress Preeclampsia:  no signs or symptoms of toxicity and intake and ouput balanced Fetal Wellbeing:  Category I Pain Control:  Epidural I/D:  n/a Anticipated MOD:  NSVD  Sydney Weaver 12/17/2015, 6:21 PM

## 2015-12-17 NOTE — Progress Notes (Signed)
[name] is a 20 y.o. [phone] at 4673w4d by ultrasound admitted for induction of labor due to hx of stillbirth at 7039 wks with prev preg..  Subjective:   Objective: BP 103/72   Pulse 68   Temp 98.5 F (36.9 C) (Oral)   Resp 20   Ht 5\' 4"  (1.626 m)   Wt 168 lb (76.2 kg)   LMP 02/20/2015 (Approximate)   SpO2 100%   BMI 28.84 kg/m  I/O last 3 completed shifts: In: -  Out: 1550 [Urine:1550] No intake/output data recorded.  FHT:  FHR: 130 bpm, variability: moderate,  accelerations:  Abscent,  decelerations:  Absent UC:   Us's tachysystole. Will turn pit of and give pt pit breake and resume in two hours SVE:   Dilation: 5.5 Effacement (%): 90 Station: -2 Exam by:: Camelia EngK. Haynes, RN  Labs: Lab Results  Component Value Date   WBC 10.9 (H) 12/15/2015   HGB 9.9 (L) 12/15/2015   HCT 29.7 (L) 12/15/2015   MCV 75.8 (L) 12/15/2015   PLT 222 12/15/2015    Assessment / Plan: Induction of labor due to prev fetal demise,  progressing well on pitocin  Labor: will give pit break for two hours and resume Preeclampsia:  no signs or symptoms of toxicity Fetal Wellbeing:  Category I Pain Control:  Epidural I/D:  n/a Anticipated MOD:  NSVD  Sydney Weaver 12/17/2015, 10:54 AM

## 2015-12-17 NOTE — Progress Notes (Signed)
Patient ID: [name], female   DOB: 09/30/1995, 20 y.o.   MRN: [id number]  OB Interim Progress Note  S: patient comfortable. Family at bedside.   O: BP 96/66   Pulse 63   Temp 98.2 F (36.8 C) (Oral)   Resp 18   Ht 5\' 4"  (1.626 m)   Wt 168 lb (76.2 kg)   LMP 02/20/2015 (Approximate)   SpO2 100%   BMI 28.84 kg/m   FHT  Baseline 120s. Moderate variability, +accels, no decels.   Dilation: 5.5 Effacement (%): 80 Cervical Position: Middle Station: -2 Presentation: Vertex Exam by:: Marlynn Perking. Lawson, CNM  A/P: Cervix 6-7cm.   CTX regular Q3-494min.  Pain well controlled with epidural.   Continue Pitocin. Anticipate SVD  Freddrick MarchYashika Amin, MD 12/17/2015, 9:19 PM PGY-1

## 2015-12-17 NOTE — Anesthesia Preprocedure Evaluation (Signed)
Anesthesia Evaluation  Patient identified by MRN, date of birth, ID band Patient awake    Reviewed: Allergy & Precautions, H&P , NPO status , Patient's Chart, lab work & pertinent test results  Airway Mallampati: I  TM Distance: >3 FB Neck ROM: full    Dental no notable dental hx.    Pulmonary neg pulmonary ROS,    Pulmonary exam normal        Cardiovascular hypertension, negative cardio ROS Normal cardiovascular exam     Neuro/Psych negative neurological ROS  negative psych ROS   GI/Hepatic negative GI ROS, Neg liver ROS,   Endo/Other  negative endocrine ROS  Renal/GU negative Renal ROS     Musculoskeletal   Abdominal Normal abdominal exam  (+)   Peds  Hematology negative hematology ROS (+)   Anesthesia Other Findings   Reproductive/Obstetrics (+) Pregnancy                             Anesthesia Physical Anesthesia Plan  ASA: II  Anesthesia Plan: Epidural   Post-op Pain Management:    Induction:   Airway Management Planned:   Additional Equipment:   Intra-op Plan:   Post-operative Plan:   Informed Consent: I have reviewed the patients History and Physical, chart, labs and discussed the procedure including the risks, benefits and alternatives for the proposed anesthesia with the patient or authorized representative who has indicated his/her understanding and acceptance.     Plan Discussed with:   Anesthesia Plan Comments:         Anesthesia Quick Evaluation

## 2015-12-17 NOTE — Progress Notes (Signed)
Sydney Weaver is a 20 y.o. [phone] at 5532w4d admitted for induction of labor due to hx of stillbirth at 30 weeks.  Subjective: Pt comfortable with epidural.  S/O in room for support.  Objective: BP 102/69   Pulse 74   Temp 98.2 F (36.8 C) (Oral)   Resp 20   Ht 5\' 4"  (1.626 m)   Wt 168 lb (76.2 kg)   LMP 02/20/2015 (Approximate)   SpO2 100%   BMI 28.84 kg/m  No intake/output data recorded. Total I/O In: -  Out: 1100 [Urine:1100]  FHT:  FHR: 125 bpm, variability: moderate,  accelerations:  Present,  decelerations:  Absent UC:   regular, every 2-3 minutes SVE:   5/70/-2 per Thalia BloodgoodSam Weinhold, CNM student IUPC placed without difficulty. Pt tolerated well.   Labs: Lab Results  Component Value Date   WBC 10.9 (H) 12/15/2015   HGB 9.9 (L) 12/15/2015   HCT 29.7 (L) 12/15/2015   MCV 75.8 (L) 12/15/2015   PLT 222 12/15/2015    Assessment / Plan: Induction of labor due to hx of stillbirth in previous pregnancy No cervical change in several hours  Labor: No progress with frequent contractions making it difficult to increase Pitocin. IUPC placed, will evaluate MVUs to adjust Pitocin. Preeclampsia:  n/a Fetal Wellbeing:  Category I Pain Control:  Epidural I/D:  GBS neg Anticipated MOD:  NSVD  LEFTWICH-KIRBY, Quynh Basso 12/17/2015, 5:53 AM

## 2015-12-18 ENCOUNTER — Encounter (HOSPITAL_COMMUNITY): Payer: Self-pay

## 2015-12-18 DIAGNOSIS — Z3A39 39 weeks gestation of pregnancy: Secondary | ICD-10-CM

## 2015-12-18 MED ORDER — TETANUS-DIPHTH-ACELL PERTUSSIS 5-2.5-18.5 LF-MCG/0.5 IM SUSP: 0.5000 mL | Freq: Once | INTRAMUSCULAR | Status: DC

## 2015-12-18 MED ORDER — PRENATAL MULTIVITAMIN CH
1.0000 | ORAL_TABLET | Freq: Every day | ORAL | Status: DC
  Administered 2015-12-18 – 2015-12-19 (×2): 1 via ORAL
  Filled 2015-12-18 (×2): qty 1

## 2015-12-18 MED ORDER — WITCH HAZEL-GLYCERIN EX PADS: 1.0000 "application " | MEDICATED_PAD | CUTANEOUS | Status: DC | PRN

## 2015-12-18 MED ORDER — ZOLPIDEM TARTRATE 5 MG PO TABS: 5.0000 mg | ORAL_TABLET | Freq: Every evening | ORAL | Status: DC | PRN

## 2015-12-18 MED ORDER — ONDANSETRON HCL 4 MG/2ML IJ SOLN: 4.0000 mg | INTRAMUSCULAR | Status: DC | PRN

## 2015-12-18 MED ORDER — COCONUT OIL OIL
1.0000 "application " | TOPICAL_OIL | Status: DC | PRN
  Administered 2015-12-19: 1 via TOPICAL
  Filled 2015-12-18: qty 120

## 2015-12-18 MED ORDER — SENNOSIDES-DOCUSATE SODIUM 8.6-50 MG PO TABS
2.0000 | ORAL_TABLET | ORAL | Status: DC
  Administered 2015-12-19 (×2): 2 via ORAL
  Filled 2015-12-18 (×2): qty 2

## 2015-12-18 MED ORDER — IBUPROFEN 600 MG PO TABS
600.0000 mg | ORAL_TABLET | Freq: Four times a day (QID) | ORAL | Status: DC
  Administered 2015-12-18 – 2015-12-20 (×9): 600 mg via ORAL
  Filled 2015-12-18 (×9): qty 1

## 2015-12-18 MED ORDER — DIBUCAINE 1 % RE OINT: 1.0000 "application " | TOPICAL_OINTMENT | RECTAL | Status: DC | PRN

## 2015-12-18 MED ORDER — DIPHENHYDRAMINE HCL 25 MG PO CAPS: 25.0000 mg | ORAL_CAPSULE | Freq: Four times a day (QID) | ORAL | Status: DC | PRN

## 2015-12-18 MED ORDER — SIMETHICONE 80 MG PO CHEW
80.0000 mg | CHEWABLE_TABLET | ORAL | Status: DC | PRN
Start: 2015-12-18 — End: 2015-12-20

## 2015-12-18 MED ORDER — ACETAMINOPHEN 325 MG PO TABS: 650.0000 mg | ORAL_TABLET | ORAL | Status: DC | PRN

## 2015-12-18 MED ORDER — BENZOCAINE-MENTHOL 20-0.5 % EX AERO: 1.0000 "application " | INHALATION_SPRAY | CUTANEOUS | Status: DC | PRN

## 2015-12-18 MED ORDER — ONDANSETRON HCL 4 MG PO TABS: 4.0000 mg | ORAL_TABLET | ORAL | Status: DC | PRN

## 2015-12-18 NOTE — Lactation Note (Signed)
This note was copied from a baby's chart. Lactation Consultation Note  Baby 11 hours old and sleeping.  Has not bf since this morning. Exp  BF for one year. Reviewed hand expression and gave baby drops on spoon. Undressed baby and attempted to bf but baby was too sleepy to latch. Mother inquired about bath.  Suggest she retry bf when doing STS after bath. Encouraged her to undress baby for feedings and offer breast before formula to help establish her milk supply. Discussed basics. Mom made aware of O/P services, breastfeeding support groups, community resources, and our phone # for post-discharge questions.  Mom encouraged to feed baby 8-12 times/24 hours and with feeding cues.  Spoke to RN and she also has been encouraging mother to undress baby for feedings.  Patient Name: Sydney Weaver's Date: 12/18/2015 Reason for consult: Initial assessment   Maternal Data Has patient been taught Hand Expression?: Yes Does the patient have breastfeeding experience prior to this delivery?: Yes  Feeding    LATCH Score/Interventions                      Lactation Tools Discussed/Used     Consult Status Consult Status: Follow-up Date: 12/19/15 Follow-up type: In-patient    Dahlia ByesBerkelhammer, Maddex Garlitz [facility] 12/18/2015, 2:23 PM

## 2015-12-18 NOTE — Anesthesia Postprocedure Evaluation (Signed)
Anesthesia Post Note  Patient: Marvel PlanLouise Scholes  Procedure(s) Performed: * No procedures listed *  Patient location during evaluation: Mother Baby Anesthesia Type: Epidural Level of consciousness: awake and alert Pain management: pain level controlled Vital Signs Assessment: post-procedure vital signs reviewed and stable Respiratory status: spontaneous breathing, nonlabored ventilation and respiratory function stable Cardiovascular status: stable Postop Assessment: no headache, no backache and epidural receding Anesthetic complications: no     Last Vitals:  Vitals:   12/18/15 0510 12/18/15 0631  BP: 114/67 113/64  Pulse: 96 95  Resp: 18 20  Temp: 37.6 C 36.9 C    Last Pain:  Vitals:   12/18/15 0631  TempSrc: Oral  PainSc: 6    Pain Goal: Patients Stated Pain Goal: 2 (12/18/15 0547)               Junious SilkGILBERT,Ludie Hudon

## 2015-12-18 NOTE — Consult Note (Signed)
Neonatology Note:  Attendance at Code Apgar:   Our team responded to a Code Apgar call to room # 173 following NSVD, due to infant who was slow to breathe. The requesting physician was M. Lawson, CNM, for Dr. Harraway-Smith. The mother is a G4P2L1 O pos, GBS neg with neg with recent decreased fetal movement, but reactive NST. She has a history of stillbirth at 30 weeks with previous pregnancy. ROM occurred 22 hours PTD and the fluid was clear. There had been some variable FHR decelerations during labor and a tight nuchal cord at birth. At delivery, the baby was stunned and slow to cry. The OB nursing staff in attendance gave vigorous stimulation and a Code Apgar was called. Our team arrived at 1.5 minutes of life, at which time the baby was breathing, but blue, with normal HR and decreased tone. We did bulb suctioning and gave stimulation, after which he began to cry slowly. Pulse oximetry showed the O2 sats were within desired range throughout. He appeared fully recovered by 7-8 minutes, pink, well-perfused, alert, without distress. Ap 1-min not yet assigned by OB staff/8/9.  I spoke with the parents in the DR, then transferred the baby to the Pediatrician's care.   Sydney Alcaide C. Mat Stuard, MD 

## 2015-12-18 NOTE — Progress Notes (Signed)
Patient was asking to take a shower approximately 7 hours post delivery. I asked patient to stand up so I could assess her balance and ability to walk. She struggled to get out of bed and stand steadily, so I determined it was not safe for her to take a shower at this time. I explained this to her, and I suggested taking her to the bathroom and assisting with washing her off with washcloths instead. She agreed to this but did not appear happy with the idea. I stepped out of the room to get a thermometer to complete my assessment on the baby, and upon returning she was talking on the phone so I did not mention washing off again.

## 2015-12-18 NOTE — Progress Notes (Signed)
UR chart review completed.  

## 2015-12-19 NOTE — Progress Notes (Signed)
POSTPARTUM PROGRESS NOTE  Post Partum Day 1 Subjective:  Sydney Weaver is a 20 y.o. [id number] 3563w5d s/p SVD.  No acute events overnight.  Pt denies problems with ambulating, voiding or po intake.  She denies nausea or vomiting.  Pain is well controlled.  She has had flatus. She has not had bowel movement.  Lochia Minimal.   Objective: Blood pressure 114/62, pulse 80, temperature 98.3 F (36.8 C), temperature source Oral, resp. rate 18, height 5\' 4"  (1.626 m), weight 168 lb (76.2 kg), last menstrual period 02/20/2015, SpO2 100 %, unknown if currently breastfeeding.  Physical Exam:  General: alert, cooperative and no distress Lochia:normal flow Chest: CTAB Heart: RRR no m/r/g Abdomen: +BS, soft, nontender,  Uterine Fundus: firm, intact DVT Evaluation: No calf swelling or tenderness Extremities: no edema  No results for input(s): HGB, HCT in the last 72 hours.  Assessment/Plan:  ASSESSMENT: Sydney Weaver is a 20 y.o. [phone] 7263w5d s/p SVD  Plan for discharge tomorrow, Breastfeeding and Contraception Depo-provera   LOS: 4 days   Berniece SalinesWALLACE, NOAH I, DO PGY-3 [facility]  12/19/2015, 7:15 AM

## 2015-12-19 NOTE — Plan of Care (Signed)
Problem: Activity: Goal: Will verbalize the importance of balancing activity with adequate rest periods Outcome: Progressing Mother rests when baby resting - understands balancing activity with adequate rest periods.   Problem: Life Cycle: Goal: Risk for postpartum hemorrhage will decrease Outcome: Progressing Patients VSS. Fundus is firm @ u. Lochia is scant.   Problem: Nutritional: Goal: Dietary intake will improve Outcome: Completed/Met Date Met: 12/19/15 Patient is able to tolerate regular diet with no c/o N/V.  Goal: Mothers verbalization of comfort with breastfeeding process will improve Outcome: Progressing Pt has pain with latch - baby looks to have tongue tied. Offered help with latch but mother states it is to painful. Brought coconut oil to mother for between feedings for comfort. Also given patient handpump and reviewed how to use it with patient. At this time patient has been giving bottles throughout the night - offered nipple shield for comfort but is refusing breast feeding at this time.   Problem: Pain Management: Goal: General experience of comfort will improve and pain level will decrease Outcome: Progressing Pt able to remain comfortable and pain at a tolerable level with schedule motrin.

## 2015-12-19 NOTE — Lactation Note (Addendum)
This note was copied from a baby's chart. Lactation Consultation Note Follow up visit at 41 hours of age.  Baby asleep in crib and mom reports recent feeding.  Mom reports sore nipples with bilateral compression stripe bruising noted.  Mom is bottle feeding formula and latching baby some. Lc discussed with mom and she plans to continue both as she did with an older child.  LC discussed positioning in cross cradle hold and using pillows for support to prevent further soreness. LC encouraged mom to work on hand expression prior to latch, breast compressions during feeding and hand expression after feeding to apply colostrum to sore nipples.   Mom reports not wanting baby to burp at the breast due to "African" beliefs that is will "cause knots and infections."  Lc advised mom as normal in this culture and encouraged burping after feedings and explained it may not be possible to stop baby from burping "at the breast." LC encouraged mom to breast feedign 8-12 times daily to establish a good milk supply.   Mom to call for assist as needed.     Patient Name: Sydney Weaver'XToday's Date: 12/19/2015 Reason for consult: Follow-up assessment;Breast/nipple pain;Difficult latch   Maternal Data Has patient been taught Hand Expression?: Yes  Feeding Feeding Type: Breast Fed Length of feed: 40 min  LATCH Score/Interventions                Intervention(s): Breastfeeding basics reviewed;Support Pillows;Position options     Lactation Tools Discussed/Used     Consult Status Consult Status: Follow-up Date: 12/20/15 Follow-up type: In-patient    Beverely RisenShoptaw, Arvella MerlesJana Lynn 12/19/2015, 8:50 PM

## 2015-12-20 ENCOUNTER — Ambulatory Visit: Payer: Self-pay

## 2015-12-20 MED ORDER — IBUPROFEN 600 MG PO TABS: 600.0000 mg | ORAL_TABLET | Freq: Four times a day (QID) | ORAL | 0 refills | Status: AC

## 2015-12-20 NOTE — Lactation Note (Signed)
This note was copied from a baby's chart. Lactation Consultation Note: Mother denies having any questions. She states that infant is feeding well . She can hear infant swallow. Mother was advised to use good massage if breast get too engorged and ice for 15 mins every 3-4 hours.mother has been supplemtenting infant . Advised to breast feed 8-12 times in 24 hours. Mother receptive to all teaching. Mother is aware of available lactation servies.   Patient Name: Sydney Weaver'XToday's Date: 12/20/2015     Maternal Data    Feeding Feeding Type: Bottle Fed - Formula Length of feed: 40 min  LATCH Score/Interventions                      Lactation Tools Discussed/Used     Consult Status      Michel BickersKendrick, Happy Begeman McCoy 12/20/2015, 12:03 PM

## 2015-12-20 NOTE — Discharge Instructions (Signed)

## 2015-12-20 NOTE — Plan of Care (Signed)
Problem: Activity: Goal: Ability to tolerate increased activity will improve Outcome: Completed/Met Date Met: 12/20/15 Patient up ad lib - steady gait.  Problem: Bowel/Gastric: Goal: Gastrointestinal status will improve Outcome: Completed/Met Date Met: 12/20/15 Pt able to pass bowel movement.   Problem: Urinary Elimination: Goal: Ability to reestablish a normal urinary elimination pattern will improve Outcome: Completed/Met Date Met: 12/20/15 Patient able to void with no difficulty or pain. Tolerating liquids with no c/o nausea vomiting diarrhea.

## 2015-12-20 NOTE — Discharge Summary (Signed)
OB Discharge Summary     Patient Name: Sydney Weaver Ems DOB: 1995/02/28 MRN: [id number]  Date of admission: 12/15/2015 Delivering MD: Freddrick MarchAMIN, YASHIKA   Date of discharge: 12/20/2015  Admitting diagnosis: INDUCTION Intrauterine pregnancy: 4932w5d     Secondary diagnosis:  Active Problems:   Labor and delivery, indication for care  Additional problems: none     Discharge diagnosis: Term Pregnancy Delivered                                                                                                Post partum procedures:none  Augmentation: Pitocin, Cytotec and Foley Balloon  Complications: None  Hospital course:  Induction of Labor With Vaginal Delivery   20 y.o. yo [id number] at 8132w5d was admitted to the hospital 12/15/2015 for induction of labor.  Indication for induction: History of Stillbirth.  Patient had an uncomplicated labor course as follows: Membrane Rupture Time/Date: 5:30 AM ,12/17/2015   Intrapartum Procedures: Episiotomy: None [1]                                         Lacerations:  None [1]  Patient had delivery of a Viable infant.  Information for the patient's newborn:  Burnell BlanksMulbah, Boy Shilpa [[id number]][[id number]]  Delivery Method: Vaginal, Spontaneous Delivery (Filed from Delivery Summary)   12/18/2015  Details of delivery can be found in separate delivery note.  Patient had a routine postpartum course. Patient is discharged home 12/20/15.   Physical exam Vitals:   12/19/15 0528 12/19/15 1854 12/19/15 2000 12/20/15 0559  BP: 114/62 114/72 113/74 121/81  Pulse: 80 75 82 81  Resp: 18 18 18 18   Temp: 98.3 F (36.8 C) 97.8 F (36.6 C) 98.2 F (36.8 C) 98.3 F (36.8 C)  TempSrc: Oral Oral Oral Oral  SpO2:   100%   Weight:      Height:       General: alert, cooperative and no distress Lochia: appropriate Uterine Fundus: firm Incision: Healing well with no significant drainage DVT Evaluation: No evidence of DVT seen on physical exam. Labs: Lab Results  Component  Value Date   WBC 10.9 (H) 12/15/2015   HGB 9.9 (L) 12/15/2015   HCT 29.7 (L) 12/15/2015   MCV 75.8 (L) 12/15/2015   PLT 222 12/15/2015   CMP Latest Ref Rng & Units 10/18/2015  Glucose 65 - 104 mg/dL 85  BUN 6 - 20 mg/dL -  Creatinine 1.300.44 - 8.651.00 mg/dL -  Sodium 784135 - 696145 mmol/L -  Potassium 3.5 - 5.1 mmol/L -  Chloride 101 - 111 mmol/L -  CO2 22 - 32 mmol/L -  Calcium 8.9 - 10.3 mg/dL -  Total Protein 6.5 - 8.1 g/dL -  Total Bilirubin 0.3 - 1.2 mg/dL -  Alkaline Phos 38 - 295126 U/L -  AST 15 - 41 U/L -  ALT 14 - 54 U/L -    Discharge instruction: per After Visit Summary and "Baby and Me Booklet".  After visit meds:    Medication  List    TAKE these medications   ibuprofen 600 MG tablet Commonly known as:  ADVIL,MOTRIN Take 1 tablet (600 mg total) by mouth every 6 (six) hours.   PRENATAL COMPLETE 14-0.4 MG Tabs Take 1 tablet by mouth daily.       Diet: routine diet  Activity: Advance as tolerated. Pelvic rest for 6 weeks.   Outpatient follow up:6 weeks Follow up Appt:Future Appointments Date Time Provider Department Center  01/25/2016 10:20 AM Marlis EdelsonWalidah N Karim, CNM [facility]   Follow up Visit:No Follow-up on file.  Postpartum contraception: Depo Provera  Newborn Data: Live born female  Birth Weight: 7 lb 11.3 oz (3495 g) APGAR: 5, 8  Baby Feeding: Breast Disposition:home with mother   12/20/2015 Wynelle BourgeoisWILLIAMS,Dickie Cloe, CNM

## 2015-12-21 ENCOUNTER — Encounter (HOSPITAL_COMMUNITY): Payer: Self-pay

## 2015-12-21 ENCOUNTER — Inpatient Hospital Stay (HOSPITAL_COMMUNITY)
Admission: AD | Admit: 2015-12-21 | Discharge: 2015-12-22 | Disposition: A | Discharge status: Home / Self Care | Payer: Medicaid Other | Source: Ambulatory Visit | Attending: Obstetrics and Gynecology | Admitting: Obstetrics and Gynecology

## 2015-12-21 DIAGNOSIS — R1030 Lower abdominal pain, unspecified: Secondary | ICD-10-CM | POA: Insufficient documentation

## 2015-12-21 DIAGNOSIS — R52 Pain, unspecified: Secondary | ICD-10-CM

## 2015-12-21 DIAGNOSIS — O9089 Other complications of the puerperium, not elsewhere classified: Secondary | ICD-10-CM | POA: Insufficient documentation

## 2015-12-21 DIAGNOSIS — Z833 Family history of diabetes mellitus: Secondary | ICD-10-CM | POA: Insufficient documentation

## 2015-12-21 NOTE — MAU Note (Signed)
Pt reports s/p vaginal delivery on Monday, lower abd pain since this pm.

## 2015-12-22 DIAGNOSIS — Z833 Family history of diabetes mellitus: Secondary | ICD-10-CM | POA: Diagnosis not present

## 2015-12-22 DIAGNOSIS — R1030 Lower abdominal pain, unspecified: Secondary | ICD-10-CM | POA: Diagnosis present

## 2015-12-22 DIAGNOSIS — O9089 Other complications of the puerperium, not elsewhere classified: Secondary | ICD-10-CM | POA: Diagnosis not present

## 2015-12-22 LAB — URINALYSIS, ROUTINE W REFLEX MICROSCOPIC
BILIRUBIN URINE: NEGATIVE
Glucose, UA: NEGATIVE mg/dL
KETONES UR: NEGATIVE mg/dL
NITRITE: NEGATIVE
PH: 8 (ref 5.0–8.0)
Protein, ur: NEGATIVE mg/dL
Specific Gravity, Urine: 1.015 (ref 1.005–1.030)

## 2015-12-22 LAB — CBC
HCT: 30.1 % — ABNORMAL LOW (ref 36.0–46.0)
Hemoglobin: 9.6 g/dL — ABNORMAL LOW (ref 12.0–15.0)
MCH: 24.2 pg — AB (ref 26.0–34.0)
MCHC: 31.9 g/dL (ref 30.0–36.0)
MCV: 76 fL — ABNORMAL LOW (ref 78.0–100.0)
PLATELETS: 198 10*3/uL (ref 150–400)
RBC: 3.96 MIL/uL (ref 3.87–5.11)
RDW: 15.1 % (ref 11.5–15.5)
WBC: 14 10*3/uL — ABNORMAL HIGH (ref 4.0–10.5)

## 2015-12-22 LAB — URINALYSIS, MICROSCOPIC (REFLEX)

## 2015-12-22 MED ORDER — KETOROLAC TROMETHAMINE 60 MG/2ML IM SOLN
60.0000 mg | Freq: Once | INTRAMUSCULAR | Status: AC
  Administered 2015-12-22: 60 mg via INTRAMUSCULAR
  Filled 2015-12-22: qty 2

## 2015-12-22 MED ORDER — CEPHALEXIN 500 MG PO CAPS: 500.0000 mg | ORAL_CAPSULE | Freq: Three times a day (TID) | ORAL | 0 refills | Status: DC

## 2015-12-22 NOTE — MAU Provider Note (Signed)
History     CSN: [id number]  Arrival date and time: 12/21/15 2325   First Provider Initiated Contact with Patient 12/22/15 0021      Chief Complaint  Patient presents with  . Abdominal Pain   Sydney Weaver is a 20 y.o. [id number] who is S/P NSVD on 12/18/15. She is here tonight with lower abdominal pain. She states that the pain comes and goes, and is crampy. She reports that it is worse with nursing. Her bleeding is "normal" at this time.    Abdominal Pain  This is a new problem. The current episode started today. The onset quality is gradual. The problem occurs constantly. The problem has been unchanged. The pain is located in the suprapubic region. The pain is at a severity of 7/10. The quality of the pain is cramping. The abdominal pain does not radiate. Pertinent negatives include no constipation, diarrhea, dysuria, fever, frequency, nausea or vomiting. Exacerbated by: breastfeeding, and not having a BM  The pain is relieved by nothing. Treatments tried: ibuprofen  The treatment provided no relief.   Past Medical History:  Diagnosis Date  . Pregnancy induced hypertension    during delivery    History reviewed. No pertinent surgical history.  Family History  Problem Relation Age of Onset  . Diabetes Mother   . Diabetes Maternal Uncle     Social History  Substance Use Topics  . Smoking status: Never Smoker  . Smokeless tobacco: Never Used  . Alcohol use No    Allergies: No Known Allergies  Prescriptions Prior to Admission  Medication Sig Dispense Refill Last Dose  . ibuprofen (ADVIL,MOTRIN) 600 MG tablet Take 1 tablet (600 mg total) by mouth every 6 (six) hours. 30 tablet 0 12/21/2015 at Unknown time  . Prenatal Vit-Fe Fumarate-FA (PRENATAL COMPLETE) 14-0.4 MG TABS Take 1 tablet by mouth daily. (Patient taking differently: Take 1 tablet by mouth daily. ) 60 each 0 Past Week at Unknown time    Review of Systems  Constitutional: Negative for chills and fever.   Gastrointestinal: Positive for abdominal pain. Negative for constipation, diarrhea, nausea and vomiting.  Genitourinary: Negative for dysuria, frequency and urgency.   Physical Exam   Blood pressure 116/80, pulse 87, temperature 99.1 F (37.3 C), temperature source Oral, resp. rate 17, last menstrual period 02/20/2015, SpO2 100 %, unknown if currently breastfeeding.  Physical Exam  Nursing note and vitals reviewed. Constitutional: She is oriented to person, place, and time. She appears well-developed and well-nourished. No distress.  HENT:  Head: Normocephalic.  Cardiovascular: Normal rate.   Respiratory: Effort normal.  GI: Soft. Bowel sounds are normal. There is no tenderness. There is no rebound.  Genitourinary:  Genitourinary Comments: Fundus: U-2, mildly-tender   Neurological: She is alert and oriented to person, place, and time.  Skin: Skin is warm and dry.  Psychiatric: She has a normal mood and affect.   Results for orders placed or performed during the hospital encounter of 12/21/15 (from the past 24 hour(s))  Urinalysis, Routine w reflex microscopic     Status: Abnormal   Collection Time: 12/22/15 12:04 AM  Result Value Ref Range   Color, Urine YELLOW YELLOW   APPearance CLEAR CLEAR   Specific Gravity, Urine 1.015 1.005 - 1.030   pH 8.0 5.0 - 8.0   Glucose, UA NEGATIVE NEGATIVE mg/dL   Hgb urine dipstick LARGE (A) NEGATIVE   Bilirubin Urine NEGATIVE NEGATIVE   Ketones, ur NEGATIVE NEGATIVE mg/dL   Protein, ur NEGATIVE NEGATIVE mg/dL  Nitrite NEGATIVE NEGATIVE   Leukocytes, UA SMALL (A) NEGATIVE  Urinalysis, Microscopic (reflex)     Status: Abnormal   Collection Time: 12/22/15 12:04 AM  Result Value Ref Range   RBC / HPF 0-5 0 - 5 RBC/hpf   WBC, UA 6-30 0 - 5 WBC/hpf   Bacteria, UA FEW (A) NONE SEEN   Squamous Epithelial / LPF 0-5 (A) NONE SEEN  CBC     Status: Abnormal   Collection Time: 12/22/15 12:35 AM  Result Value Ref Range   WBC 14.0 (H) 4.0 - 10.5  K/uL   RBC 3.96 3.87 - 5.11 MIL/uL   Hemoglobin 9.6 (L) 12.0 - 15.0 g/dL   HCT 16.130.1 (L) 09.636.0 - 04.546.0 %   MCV 76.0 (L) 78.0 - 100.0 fL   MCH 24.2 (L) 26.0 - 34.0 pg   MCHC 31.9 30.0 - 36.0 g/dL   RDW 40.915.1 81.111.5 - 91.415.5 %   Platelets 198 150 - 400 K/uL    MAU Course  Procedures  MDM [id number]: D/W Dr. Alysia PennaErvin, will DC home with keflex TID x 7 days FU in the office on Monday.   Assessment and Plan   1. Postpartum pain    DC home Comfort measures reviewed  UC pending  Bleeding precautions RX: keflex TID x 7 days  Return to MAU as needed FU with OB as planned  Follow-up Information    [facility].   Specialty:  Obstetrics and Gynecology Why:  They will call you for FU appoitment on Monday  Contact information: [address] [place] [id number] [phone]           Sydney Weaver, Sydney Weaver 12/22/2015, 12:25 AM

## 2015-12-22 NOTE — Discharge Instructions (Signed)

## 2015-12-23 LAB — URINE CULTURE

## 2015-12-23 NOTE — Progress Notes (Signed)
Post Partum Day #1 Subjective: no complaints, up ad lib and tolerating PO; breastfeeding; will use Depo for contraception  Objective: Blood pressure 121/81, pulse 81, temperature 98.3 F (36.8 C), temperature source Oral, resp. rate 18, height 5\' 4"  (1.626 m), weight 76.2 kg (168 lb), last menstrual period 02/20/2015, SpO2 100 %, unknown if currently breastfeeding.  Physical Exam:  General: alert and cooperative Lochia: appropriate Uterine Fundus: firm DVT Evaluation: No evidence of DVT seen on physical exam.   Recent Labs  12/22/15 0035  HGB 9.6*  HCT 30.1*    Assessment/Plan: Plan for discharge tomorrow   LOS: 5 days   Cam HaiSHAW, KIMBERLY CNM 12/23/2015, 9:20 AM

## 2015-12-25 ENCOUNTER — Ambulatory Visit (INDEPENDENT_AMBULATORY_CARE_PROVIDER_SITE_OTHER): Payer: Self-pay | Admitting: Obstetrics and Gynecology

## 2015-12-25 ENCOUNTER — Encounter: Payer: Self-pay | Admitting: Obstetrics and Gynecology

## 2015-12-25 VITALS — BP 119/80 | HR 59 | Temp 98.3°F | Wt 162.3 lb

## 2015-12-25 DIAGNOSIS — O8612 Endometritis following delivery: Secondary | ICD-10-CM

## 2015-12-25 NOTE — Progress Notes (Signed)
Obstetrics Visit Follow Up Evaluation  Appointment Date: 12/25/2015  [facility]  Referring Provider: MAU  Chief Complaint:  Chief Complaint  Patient presents with  . Follow-up    History of Present Illness: Sydney Weaver is a 20 y.o. African-American [id number] (Patient's last menstrual period was 02/20/2015 (approximate).), seen for the above chief complaint.  Patient s/p SVD/intact perineum on 12/11 and d/c to home on PPD#2. Patient seen on 12/15 and diagnosed with PP endometritis and put on keflex. She states that she's doing much better and no fevers, chills, nausea, vomiting, dysuria and minimal to no abdominal pain. Minimal lochia.  Mood is good and has help at home with husband and is breast and formula feeding.  Review of Systems: as noted in the History of Present Illness.   Past Medical History:  Past Medical History:  Diagnosis Date  . Pregnancy induced hypertension    during delivery    Past Surgical History:  No past surgical history on file.  Past Obstetrical History:  OB History  Gravida Para Term Preterm AB Living  4 3 2 1 1 2   SAB TAB Ectopic Multiple Live Births  1     0 2    # Outcome Date GA Lbr Len/2nd Weight Sex Delivery Anes PTL Lv  4 Term 12/18/15 7548w5d 34:50 / 00:35 7 lb 11.3 oz (3.495 kg) M Vag-Spont EPI  LIV  3 SAB 2016          2 Term 07/06/12 465w0d  8 lb 4 oz (3.742 kg) M Vag-Spont Local N LIV     Birth Comments: Hypertension in pregnancy  1 Preterm 2013 6254w0d    Vag-Spont   FD      Past Gynecological History: As per HPI.  Social History:  Social History   Social History  . Marital status: Single    Spouse name: N/A  . Number of children: N/A  . Years of education: N/A   Occupational History  . Not on file.   Social History Main Topics  . Smoking status: Never Smoker  . Smokeless tobacco: Never Used  . Alcohol use No  . Drug use: No  . Sexual activity: Yes    Birth control/ protection: None    Other Topics Concern  . Not on file   Social History Narrative  . No narrative on file    Family History:  Family History  Problem Relation Age of Onset  . Diabetes Mother   . Diabetes Maternal Uncle     Medications Sydney Weaver had no medications administered during this visit. Current Outpatient Prescriptions  Medication Sig Dispense Refill  . cephALEXin (KEFLEX) 500 MG capsule Take 1 capsule (500 mg total) by mouth 3 (three) times daily. 21 capsule 0  . ibuprofen (ADVIL,MOTRIN) 600 MG tablet Take 1 tablet (600 mg total) by mouth every 6 (six) hours. 30 tablet 0  . Prenatal Vit-Fe Fumarate-FA (PRENATAL COMPLETE) 14-0.4 MG TABS Take 1 tablet by mouth daily. (Patient not taking: Reported on 12/25/2015) 60 each 0   No current facility-administered medications for this visit.     Allergies Patient has no known allergies.   Physical Exam:  BP 119/80   Pulse (!) 59   Temp 98.3 F (36.8 C)   Wt 162 lb 4.8 oz (73.6 kg)   LMP 02/20/2015 (Approximate)   Breastfeeding? Yes   BMI 27.86 kg/m  Body mass index is 27.86 kg/m. General appearance: Well nourished, well developed female in no  acute distress.  Respiratory:  Normal respiratory effort Abdomen: no masses, hernias; diffusely non tender to palpation, non distended. Fundus well below the umbilicus and nttp Neuro/Psych:  Normal mood and affect.  Skin:  Warm and dry.   Laboratory: UCx negative  Assessment: pt doing well  Plan: continue abx until complete. [facility]. Has PP visit already scheduled.   RTC 9353m  Cornelia Copaharlie Tamieka Rancourt, Jr MD Attending [facility](Faculty Practice)

## 2016-01-11 ENCOUNTER — Inpatient Hospital Stay (HOSPITAL_COMMUNITY)
Admission: AD | Admit: 2016-01-11 | Discharge: 2016-01-11 | Discharge status: Absent without leave | Payer: Medicaid Other | Attending: Obstetrics & Gynecology | Admitting: Obstetrics & Gynecology

## 2016-01-25 ENCOUNTER — Ambulatory Visit: Payer: Self-pay | Admitting: Family

## 2016-02-20 ENCOUNTER — Encounter: Payer: Self-pay | Admitting: Family

## 2016-02-20 ENCOUNTER — Ambulatory Visit (INDEPENDENT_AMBULATORY_CARE_PROVIDER_SITE_OTHER): Payer: Self-pay | Admitting: Family

## 2016-02-20 DIAGNOSIS — Z8759 Personal history of other complications of pregnancy, childbirth and the puerperium: Secondary | ICD-10-CM

## 2016-02-20 NOTE — Patient Instructions (Signed)
  [facility] [address] [place], [place]  [id number]  Phone:  [phone]

## 2016-02-20 NOTE — Progress Notes (Signed)
Subjective:     Sydney [name] is a 21 y.o. female who presents for a postpartum visit. She is is six weeks postpartum following a delivery on 12/18/2015. I have fully reviewed the prenatal and intrapartum course. The delivery was at 39 gestational weeks. Outcome: vaginal. Anesthesia: Epidual. Postpartum course has been uncomplicated. Baby's course has been uncomplicated. Baby is feeding by breast/bottle. Bleeding not at this time. Bowel function is normal. Bladder function is normal. Patient is sexually active. Contraception method is nothing at this time. Postpartum depression screening: negative  Review of Systems Pertinent items are noted in HPI.   Objective:    BP 106/72   Pulse 70   Wt 159 lb 3.2 oz (72.2 kg)   BMI 27.33 kg/m   General:  alert, cooperative and appears stated age   Breasts:  inspection negative, no nipple discharge or bleeding, no masses or nodularity palpable  Lungs: clear to auscultation bilaterally  Heart:  regular rate and rhythm, S1, S2 normal, no murmur, click, rub or gallop  Abdomen: soft, non-tender; bowel sounds normal; no masses,  no organomegaly  Pelvic exam not indicated - not bleeding, no pain at vaginal area      Assessment:     Normal postpartum exam. Pap smear not done at today's visit > pt declines.   Weaver:    1. Contraception: Desires depo-provera 2. May reschedule in 1 wk for preg test and pap smear Sydney Weaver, CNM

## 2016-02-23 ENCOUNTER — Ambulatory Visit: Payer: Self-pay | Admitting: Family

## 2016-02-23 ENCOUNTER — Institutional Professional Consult (permissible substitution): Payer: Self-pay | Admitting: Family

## 2016-03-01 ENCOUNTER — Encounter (HOSPITAL_COMMUNITY): Payer: Self-pay | Admitting: Emergency Medicine

## 2016-03-01 ENCOUNTER — Ambulatory Visit (HOSPITAL_COMMUNITY)
Admission: EM | Admit: 2016-03-01 | Discharge: 2016-03-01 | Disposition: A | Discharge status: Home / Self Care | Payer: Medicaid Other | Attending: Family Medicine | Admitting: Family Medicine

## 2016-03-01 DIAGNOSIS — R51 Headache: Secondary | ICD-10-CM | POA: Diagnosis not present

## 2016-03-01 DIAGNOSIS — R519 Headache, unspecified: Secondary | ICD-10-CM

## 2016-03-01 LAB — POCT PREGNANCY, URINE: Preg Test, Ur: NEGATIVE

## 2016-03-01 LAB — POCT I-STAT, CHEM 8
BUN: 6 mg/dL (ref 6–20)
CALCIUM ION: 1.13 mmol/L — AB (ref 1.15–1.40)
CREATININE: 0.7 mg/dL (ref 0.44–1.00)
Chloride: 105 mmol/L (ref 101–111)
GLUCOSE: 79 mg/dL (ref 65–99)
HCT: 41 % (ref 36.0–46.0)
Hemoglobin: 13.9 g/dL (ref 12.0–15.0)
Potassium: 3.9 mmol/L (ref 3.5–5.1)
Sodium: 140 mmol/L (ref 135–145)
TCO2: 24 mmol/L (ref 0–100)

## 2016-03-01 LAB — POCT URINALYSIS DIP (DEVICE)
Bilirubin Urine: NEGATIVE
Glucose, UA: NEGATIVE mg/dL
Ketones, ur: NEGATIVE mg/dL
LEUKOCYTES UA: NEGATIVE
Nitrite: NEGATIVE
PROTEIN: 100 mg/dL — AB
SPECIFIC GRAVITY, URINE: 1.02 (ref 1.005–1.030)
UROBILINOGEN UA: 0.2 mg/dL (ref 0.0–1.0)
pH: 5.5 (ref 5.0–8.0)

## 2016-03-01 NOTE — ED Provider Notes (Signed)
CSN: [id number]     Arrival date & time 03/01/16  1641 History   None    Chief Complaint  Patient presents with  . Headache   (Consider location/radiation/quality/duration/timing/severity/associated sxs/prior Treatment) The history is provided by the patient. No language interpreter was used.  Headache  Pain location:  Generalized Quality:  Unable to specify Radiates to:  Does not radiate Severity currently:  3/10 Onset quality:  Gradual Duration:  2 days Timing:  Intermittent Chronicity:  New Context: not activity   Relieved by:  Nothing Worsened by:  Nothing Ineffective treatments:  None tried Pt complains of feeling weak.  Pt reports she has decreased appetite and a headache.  Pt is currently breast feeding a 57 month old.  Past Medical History:  Diagnosis Date  . Pregnancy induced hypertension    during delivery   History reviewed. No pertinent surgical history. Family History  Problem Relation Age of Onset  . Diabetes Mother   . Diabetes Maternal Uncle    Social History  Substance Use Topics  . Smoking status: Never Smoker  . Smokeless tobacco: Never Used  . Alcohol use No   OB History    Gravida Para Term Preterm AB Living   4 3 2 1 1 2    SAB TAB Ectopic Multiple Live Births   1     0 2     Review of Systems  Neurological: Positive for headaches.  All other systems reviewed and are negative.   Allergies  Patient has no known allergies.  Home Medications   Prior to Admission medications   Medication Sig Start Date End Date Taking? Authorizing Provider  ibuprofen (ADVIL,MOTRIN) 600 MG tablet Take 1 tablet (600 mg total) by mouth every 6 (six) hours. 12/20/15  Yes Aviva Signs, CNM  Prenatal Vit-Fe Fumarate-FA (PRENATAL COMPLETE) 14-0.4 MG TABS Take 1 tablet by mouth daily. 05/03/15  Yes Gerhard Munch, MD   Meds Ordered and Administered this Visit  Medications - No data to display  BP 101/73 (BP Location: Right Arm)   Pulse 104   Temp 99 F  (37.2 C) (Oral)   Resp 19   SpO2 100%   Breastfeeding? Yes  No data found.   Physical Exam  Constitutional: She is oriented to person, place, and time. She appears well-developed and well-nourished.  HENT:  Head: Normocephalic.  Eyes: EOM are normal.  Neck: Normal range of motion.  Cardiovascular: Normal rate and regular rhythm.   Pulmonary/Chest: Effort normal and breath sounds normal.  Abdominal: Soft. She exhibits no distension. There is no tenderness.  Musculoskeletal: Normal range of motion.  Neurological: She is alert and oriented to person, place, and time.  Skin: Skin is warm.  Psychiatric: She has a normal mood and affect.  Nursing note and vitals reviewed.   Urgent Care Course     Procedures (including critical care time)  Labs Review Labs Reviewed  POCT I-STAT, CHEM 8 - Abnormal; Notable for the following:       Result Value   Calcium, Ion 1.13 (*)    All other components within normal limits  POCT URINALYSIS DIP (DEVICE) - Abnormal; Notable for the following:    Hgb urine dipstick TRACE (*)    Protein, ur 100 (*)    All other components within normal limits  I-STAT CHEM 8, ED  POCT PREGNANCY, URINE    Imaging Review No results found.   Visual Acuity Review  Right Eye Distance:   Left Eye Distance:  Bilateral Distance:    Right Eye Near:   Left Eye Near:    Bilateral Near:         MDM Pt encouraged to eat and drink normally.  Ibuprofen for headache.     1. Nonintractable headache, unspecified chronicity pattern, unspecified headache type    An After Visit Summary was printed and given to the patient.    Lonia SkinnerLeslie K HockessinSofia, PA-C 03/01/16 [phone]

## 2016-03-01 NOTE — ED Triage Notes (Signed)
Patient stated for the past two days she has a headache, feeling weak and loss of appetite. Fever and chills, patient took ibuprofen for pain yesterday. [facility] CMA Student

## 2016-03-01 NOTE — Discharge Instructions (Signed)
Make sure you are eating and drinking normally.  Increase fluids.  Continue ibuprofen for headache

## 2016-03-12 ENCOUNTER — Encounter: Payer: Self-pay | Admitting: Obstetrics and Gynecology

## 2016-03-12 ENCOUNTER — Ambulatory Visit (INDEPENDENT_AMBULATORY_CARE_PROVIDER_SITE_OTHER): Payer: Medicaid Other | Admitting: Obstetrics and Gynecology

## 2016-03-12 ENCOUNTER — Other Ambulatory Visit (HOSPITAL_COMMUNITY)
Admission: RE | Admit: 2016-03-12 | Discharge: 2016-03-12 | Disposition: A | Discharge status: Home / Self Care | Payer: Medicaid Other | Source: Ambulatory Visit | Attending: Obstetrics and Gynecology | Admitting: Obstetrics and Gynecology

## 2016-03-12 VITALS — BP 110/67 | HR 70 | Wt 159.2 lb

## 2016-03-12 DIAGNOSIS — Z0000 Encounter for general adult medical examination without abnormal findings: Secondary | ICD-10-CM

## 2016-03-12 DIAGNOSIS — Z01419 Encounter for gynecological examination (general) (routine) without abnormal findings: Secondary | ICD-10-CM | POA: Insufficient documentation

## 2016-03-12 DIAGNOSIS — Z113 Encounter for screening for infections with a predominantly sexual mode of transmission: Secondary | ICD-10-CM | POA: Insufficient documentation

## 2016-03-12 NOTE — Patient Instructions (Signed)
Preventive Care for Young Adults, Female The transition to life after high school as a young adult can be a stressful time with many changes. You may start seeing a primary care physician instead of a pediatrician. This is the time when your health care becomes your responsibility. Preventive care refers to lifestyle choices and visits with your health care provider that can promote health and wellness. What does preventive care include?  A yearly physical exam. This is also called an annual wellness visit.  Dental exams once or twice a year.  Routine eye exams. Ask your health care provider how often you should have your eyes checked.  Personal lifestyle choices, including: ? Daily care of your teeth and gums. ? Regular physical activity. ? Eating a healthy diet. ? Avoiding tobacco and drug use. ? Avoiding or limiting alcohol use. ? Practicing safe sex. ? Taking vitamin and mineral supplements as recommended by your health care provider. What happens during an annual wellness visit? Preventive care starts with a yearly visit to your primary care physician. The services and screenings done by your health care provider during your annual wellness visit will depend on your overall health, lifestyle risk factors, and family history of disease. Counseling Your health care provider may ask you questions about:  Past medical problems and your family's medical history.  Medicines or supplements you take.  Health insurance and access to health care.  Alcohol, tobacco, and drug use.  Your safety at home, work, or school.  Access to firearms.  Emotional well-being and how you cope with stress.  Relationship well-being.  Diet, exercise, and sleep habits.  Your sexual health and activity.  Your methods of birth control.  Your menstrual cycle.  Your pregnancy history.  Screening You may have the following tests or measurements:  Height, weight, and BMI.  Blood  pressure.  Lipid and cholesterol levels.  Tuberculosis skin test.  Skin exam.  Vision and hearing tests.  Screening test for hepatitis.  Screening tests for sexually transmitted diseases (STDs), if you are at risk.  BRCA-related cancer screening. This may be done if you have a family history of breast, ovarian, tubal, or peritoneal cancers.  Pelvic exam and Pap test. This may be done every 3 years starting at age 21.  Vaccines Your health care provider may recommend certain vaccines, such as:  Influenza vaccine. This is recommended every year.  Tetanus, diphtheria, and acellular pertussis (Tdap, Td) vaccine. You may need a Td booster every 10 years.  Varicella vaccine. You may need this if you have not been vaccinated.  HPV vaccine. If you are 26 or younger, you may need three doses over 6 months.  Measles, mumps, and rubella (MMR) vaccine. You may need at least one dose of MMR. You may also need a second dose.  Pneumococcal 13-valent conjugate (PCV13) vaccine. You may need this if you have certain conditions and were not previously vaccinated.  Pneumococcal polysaccharide (PPSV23) vaccine. You may need one or two doses if you smoke cigarettes or if you have certain conditions.  Meningococcal vaccine. One dose is recommended if you are age 19-21 years and a first-year college student living in a residence hall, or if you have one of several medical conditions. You may also need additional booster doses.  Hepatitis A vaccine. You may need this if you have certain conditions or if you travel or work in places where you may be exposed to hepatitis A.  Hepatitis B vaccine. You may need this if   if you have certain conditions or if you travel or work in places where you may be exposed to hepatitis B.  Haemophilus influenzae type b (Hib) vaccine. You may need this if you have certain risk factors. Talk to your health care provider about which screenings and vaccines you need and how  often you need them. What steps can I take to develop healthy behaviors?  Have regular preventive health care visits with your primary care physician and dentist.  Eat a healthy diet.  Drink enough fluid to keep your urine clear or pale yellow.  Stay active. Exercise at least 30 minutes 5 or more days of the week.  Use alcohol responsibly.  Maintain a healthy weight.  Do not use any products that contain nicotine, such as cigarettes, chewing tobacco, and e-cigarettes. If you need help quitting, ask your health care provider.  Do not use drugs.  Practice safe sex.  Use birth control (contraception) to prevent unwanted pregnancy. If you plan to become pregnant, see your health care provider for a pre-conception visit.  Find healthy ways to manage stress. How can I protect myself from injury? Injuries from violence or accidents are the leading cause of death among young adults and can often be prevented. Take these steps to help protect yourself:  Always wear your seat belt while driving or riding in a vehicle.  Do not drive if you have been drinking alcohol. Do not ride with someone who has been drinking.  Do not drive when you are tired or distracted. Do not text while driving.  Wear a helmet and other protective equipment during sports activities.  If you have firearms in your house, make sure you follow all gun safety procedures.  Seek help if you have been bullied, physically abused, or sexually abused.  Use the Internet responsibly to avoid dangers such as online bullying and online sexual predators. What can I do to cope with stress? Young adults may face many new challenges that can be stressful, such as finding a job, going to college, moving away from home, managing money, being in a relationship, getting married, and having children. To manage stress:  Avoid known stressful situations when you can.  Exercise regularly.  Find a stress-reducing activity that works  best for you. Examples include meditation, yoga, listening to music, or reading.  Spend time in nature.  Keep a journal to write about your stress and how you respond.  Talk to your health care provider about stress. He or she may suggest counseling.  Spend time with supportive friends or family.  Do not cope with stress by:  Drinking alcohol or using drugs.  Smoking cigarettes.  Eating. Where can I get more information? Learn more about preventive care and healthy habits from:  [place] and Gynecologists: KaraokeExchange.nl  U[employer] Probation officer Task Force: StageSync.si  National Adolescent and [place]: StrategicRoad.nl  American Academy of Pediatrics Bright Futures: https://brightfutures.MemberVerification.co.za  Society for Adolescent Health and Medicine: MoralBlog.co.za.aspx  PodExchange.nl: ToyLending.fr This information is not intended to replace advice given to you by your health care provider. Make sure you discuss any questions you have with your health care provider. Document Released: 05/11/2015 Document Revised: 06/01/2015 Document Reviewed: 05/11/2015 Elsevier Interactive Patient Education  2017 [place]. Pap Test Why am I having this test? A pap test is sometimes called a pap smear. It is a screening test that is used to check for signs of cancer of the vagina, cervix, and  uterus. The test can also identify the presence of infection or precancerous changes. Your health care provider will likely recommend you have this test done on a regular basis. This test may be done:  Every 3 years, starting at age 23.  Every 5 years, in combination with testing for the presence of human papillomavirus  (HPV).  More or less often depending on other medical conditions. What kind of sample is taken? Using a small cotton swab, plastic spatula, or brush, your health care provider will collect a sample of cells from the surface of your cervix. Your cervix is the opening to your uterus, also called a womb. Secretions from the cervix and vagina may also be collected. How do I prepare for this test?  Be aware of where you are in your menstrual cycle. You may be asked to reschedule the test if you are menstruating on the day of the test.  You may need to reschedule if you have a known vaginal infection on the day of the test.  You may be asked to avoid douching or taking a bath the day before or the day of the test.  Some medicines can cause abnormal test results, such as digitalis and tetracycline. Talk with your health care provider before your test if you take one of these medicines. What do the results mean? Abnormal test results may indicate a number of health conditions. These may include:  Cancer. Although pap test results cannot be used to diagnose cancer of the cervix, vagina, or uterus, they may suggest the possibility of cancer. Further tests would be required to determine if cancer is present.  Sexually transmitted disease.  Fungal infection.  Parasite infection.  Herpes infection.  A condition causing or contributing to infertility. It is your responsibility to obtain your test results. Ask the lab or department performing the test when and how you will get your results. Contact your health care provider to discuss any questions you have about your results. Talk with your health care provider to discuss your results, treatment options, and if necessary, the need for more tests. Talk with your health care provider if you have any questions about your results. This information is not intended to replace advice given to you by your health care provider. Make sure you discuss any  questions you have with your health care provider. Document Released: 03/16/2002 Document Revised: 08/30/2015 Document Reviewed: 05/17/2013 Elsevier Interactive Patient Education  2017 [employer].

## 2016-03-12 NOTE — Progress Notes (Addendum)
Reason for Visit: Annual GYN and STI check SUBJECTIVE: HPI Sydney Weaver is a 21 y.o. [id number] presenting for GYN exam and STI check. She has an 6211 week old and has had her postpartum exam. She is still breast-feeding without supplementation. She is still undecided about contraception and has a new sexual partner so would like to be checked for all STI's. Denies any irritative vaginal discharge.  Review of Systems  Constitutional: Negative for fever.  Gastrointestinal: Negative for abdominal pain, constipation and diarrhea.  Skin: Negative for rash.  Neurological: Negative for headaches.  Psychiatric/Behavioral: Negative for depression and substance abuse. The patient is not nervous/anxious.    Past Medical History:  Diagnosis Date  . Pregnancy induced hypertension    during delivery   OB History  Gravida Para Term Preterm AB Living  4 3 2 1 1 2   SAB TAB Ectopic Multiple Live Births  1     0 2    # Outcome Date GA Lbr Len/2nd Weight Sex Delivery Anes PTL Lv  4 Term 12/18/15 5719w5d 34:50 / 00:35 3.495 kg (7 lb 11.3 oz) M Vag-Spont EPI  LIV  3 SAB 2016          2 Term 07/06/12 424w0d  3.742 kg (8 lb 4 oz) M Vag-Spont Local N LIV     Birth Comments: Hypertension in pregnancy  1 Preterm 2013 4993w0d    Vag-Spont   FD    No past surgical history on file. Family History  Problem Relation Age of Onset  . Diabetes Mother   . Diabetes Maternal Uncle      OBJECTIVE: Vitals:   03/12/16 1046  BP: 110/67  Pulse: 70   Physical Exam  Constitutional: She appears well-developed and well-nourished. No distress.  HENT:  Head: Normocephalic.  Eyes: Pupils are equal, round, and reactive to light.  Neck: Normal range of motion.  Cardiovascular: Normal rate, regular rhythm and normal heart sounds.   Pulmonary/Chest: Breath sounds normal.  Abdominal: Soft. There is no tenderness.  Genitourinary: Vagina normal and uterus normal. No vaginal discharge found.  Genitourinary Comments: Pelvic:  NEFG, the proximal vagina pink and rugated with minimal homogenous white discharge; cervix clean, multiparous; uterus normal shape size position; adnexa I no tenderness or masses  Nursing note and vitals reviewed.   Well woman exam with routine gynecological exam - Plan: Cytology - PAP, HIV antibody (with reflex), Hepatitis B Surface AntiGEN, RPR  Encounter for annual physical examination excluding gynecological examination in a patient older than 17 years  Allergies as of 03/12/2016   No Known Allergies     Medication List       Accurate as of 03/12/16 11:59 PM. Always use your most recent med list.          ibuprofen 600 MG tablet Commonly known as:  ADVIL,MOTRIN Take 1 tablet (600 mg total) by mouth every 6 (six) hours.   PRENATAL COMPLETE 14-0.4 MG Tabs Take 1 tablet by mouth daily.

## 2016-03-14 LAB — CYTOLOGY - PAP
CHLAMYDIA, DNA PROBE: NEGATIVE
DIAGNOSIS: NEGATIVE
Neisseria Gonorrhea: NEGATIVE
TRICH (WINDOWPATH): NEGATIVE

## 2016-08-05 IMAGING — US US OB COMP LESS 14 WK
2 series · 14 of 28 positions shown · non-contrast
Comparison: None.

CLINICAL DATA: Positive urine pregnancy test with pelvic pain for 1
day.

EXAM:
OBSTETRIC <14 WK US AND TRANSVAGINAL OB US
TECHNIQUE: Both transabdominal and transvaginal ultrasound examinations were
performed for complete evaluation of the gestation as well as the
maternal uterus, adnexal regions, and pelvic cul-de-sac.
Transvaginal technique was performed to assess early pregnancy.

[Series 1: us ob comp less 14 wk · 0.22mm/px · 13 of 72 slices shown (1 of 2)]
[im 3/72]
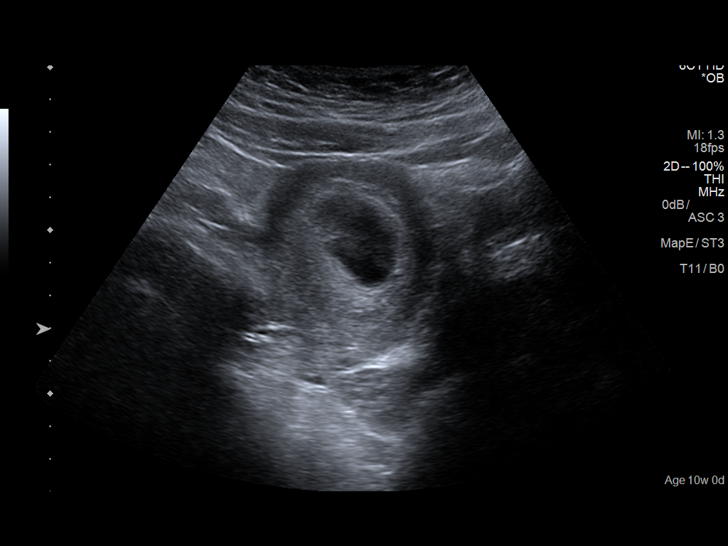
[im 9/72]
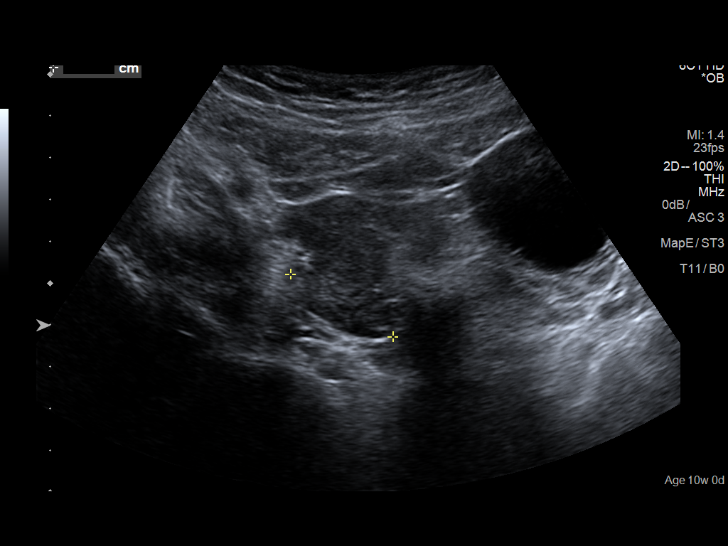
[im 14/72]
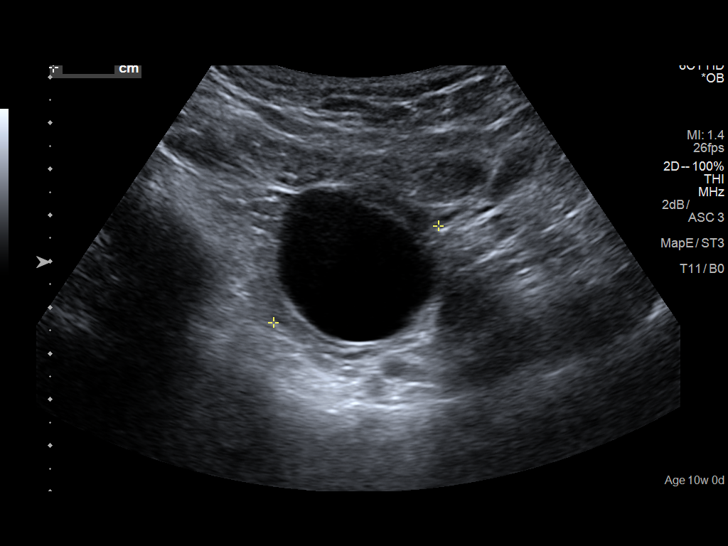
[im 20/72]
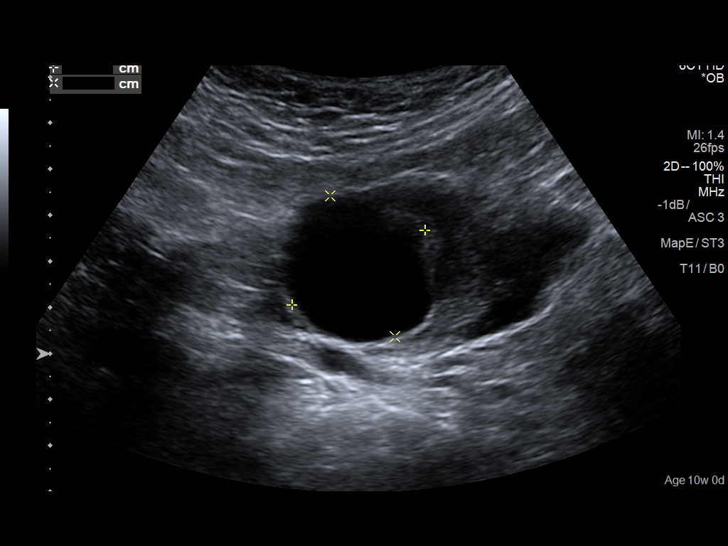
[im 25/72]
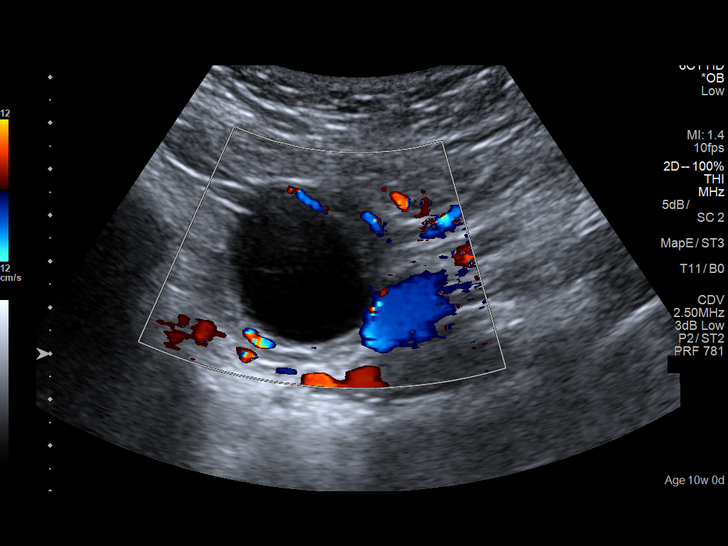
[im 31/72]
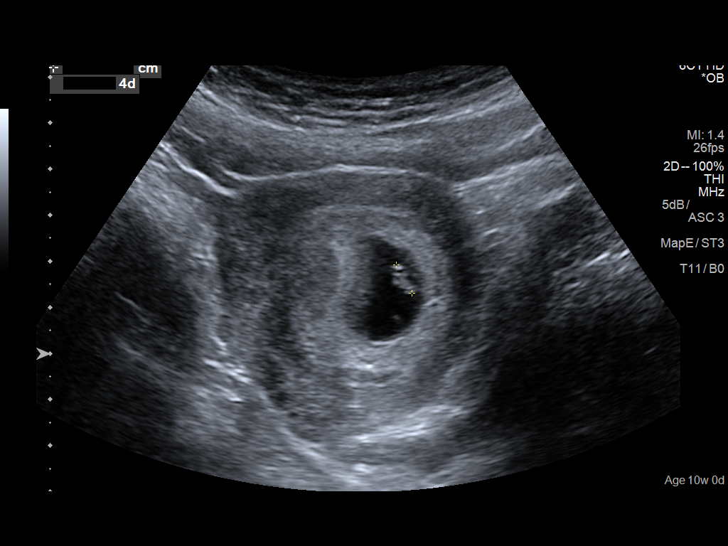
[im 36/72]
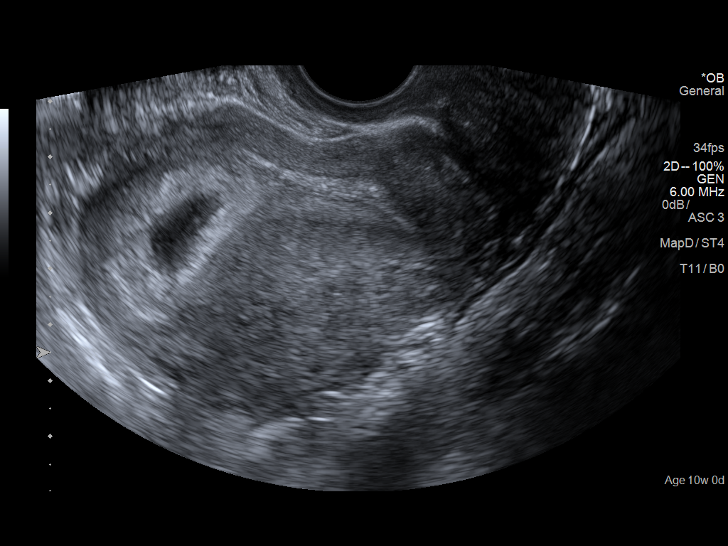
[im 41/72]
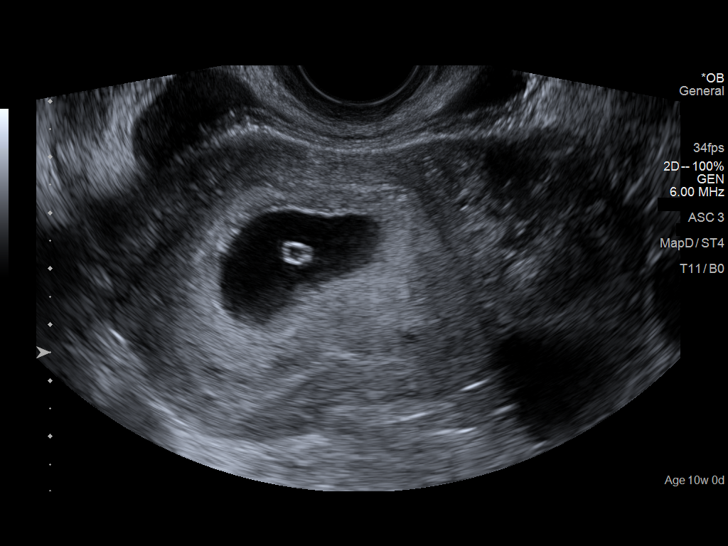
[im 47/72]
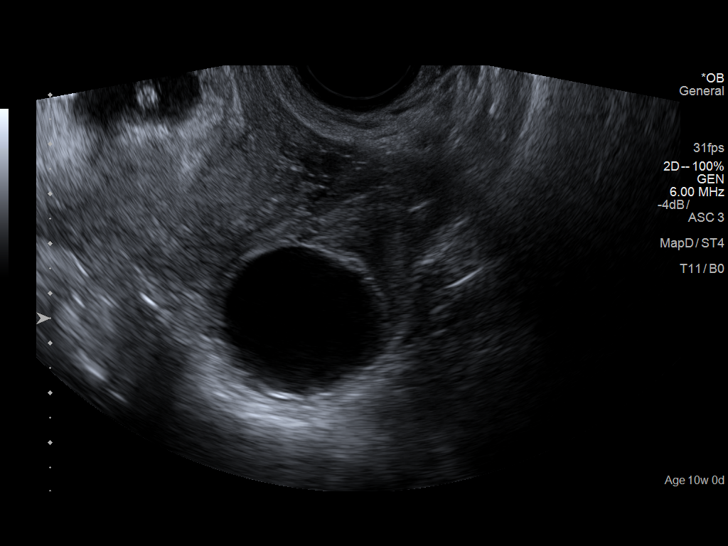
[im 52/72]
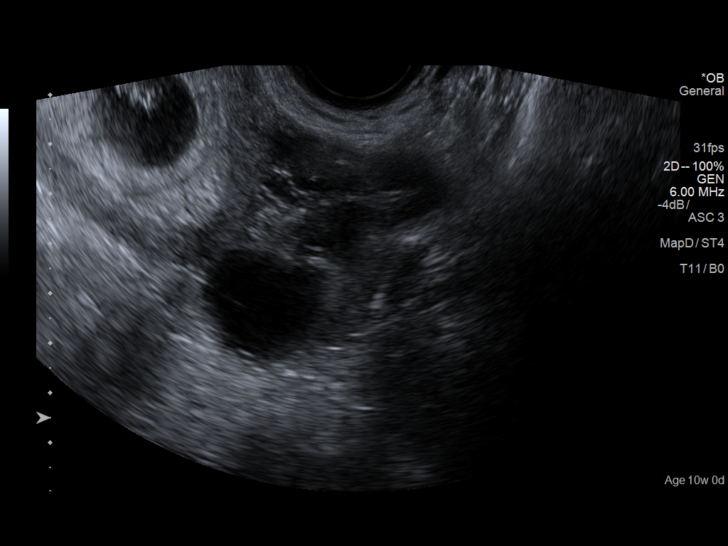
[im 58/72]
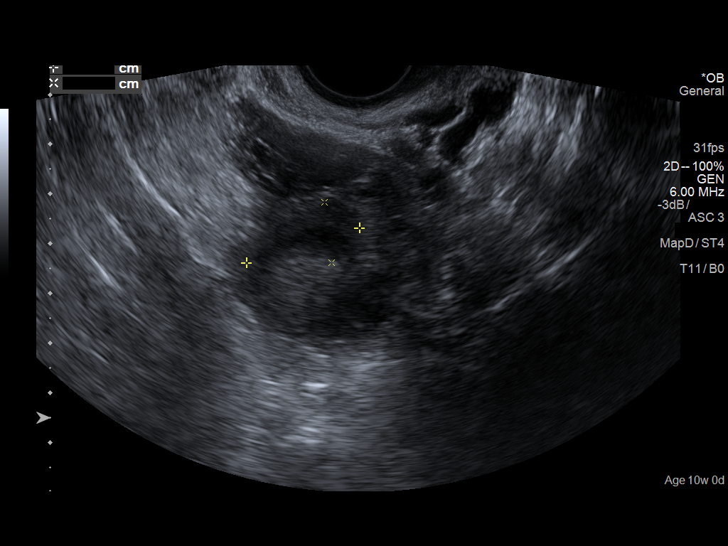
[im 63/72]
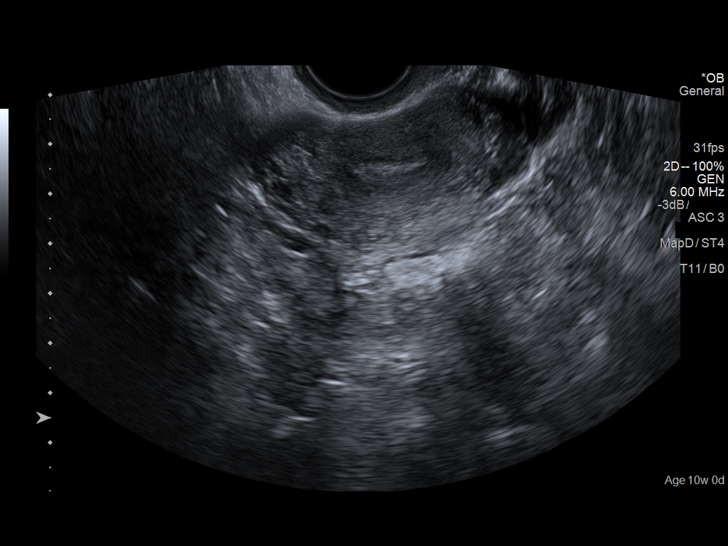
[im 69/72]
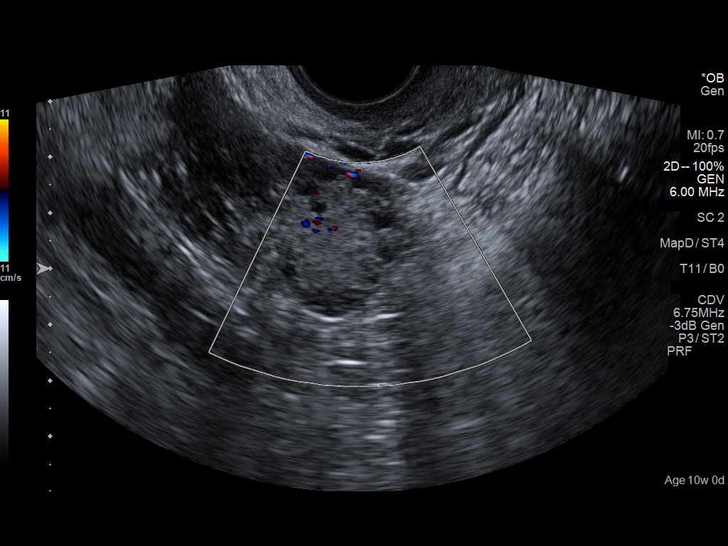

[Series 3: us ob comp less 14 wk · 1 of 1 slices shown (2 of 2)]
[im 1/1]
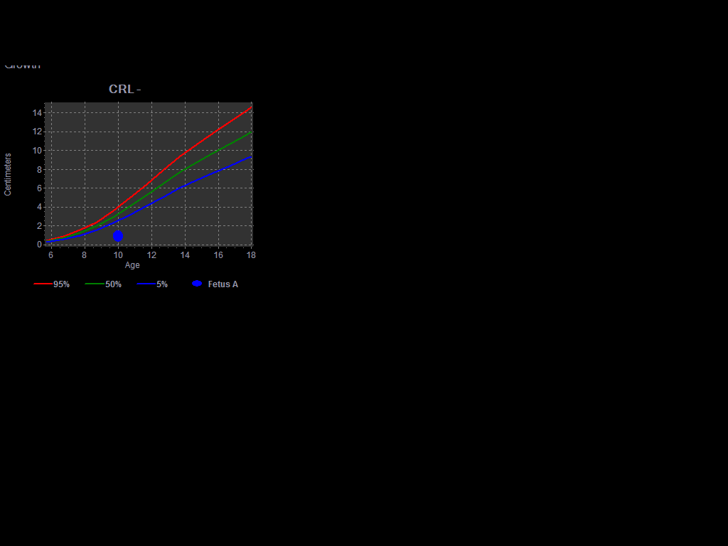

[14 of 28 positions shown; findings below may reference images not displayed]

FINDINGS: Intrauterine gestational sac: Single, normal in configuration.

Yolk sac:  Visualized.

Embryo:  Visualized.

Cardiac Activity: Visualized.

Heart Rate: 132  bpm

CRL:  0.95  mm   7 w   0 d                  US EDC: 12/20/2015

Subchorionic hemorrhage:  None visualized.

Maternal uterus/adnexae: Within normal limits. Ovaries are
visualized. No free fluid.
IMPRESSION: Single living intrauterine pregnancy with gestational age of 7 weeks
0 days and estimated date of confinement 12/20/2015. No complicating
features.

## 2016-08-12 ENCOUNTER — Encounter (HOSPITAL_COMMUNITY): Payer: Self-pay | Admitting: Emergency Medicine

## 2016-08-12 DIAGNOSIS — Z5321 Procedure and treatment not carried out due to patient leaving prior to being seen by health care provider: Secondary | ICD-10-CM | POA: Insufficient documentation

## 2016-08-12 NOTE — ED Triage Notes (Signed)
Pt reports having headache and nausea for the last week.

## 2016-08-13 ENCOUNTER — Emergency Department (HOSPITAL_COMMUNITY)
Admission: EM | Admit: 2016-08-13 | Discharge: 2016-08-13 | Discharge status: Against Medical Advice | Payer: Medicaid Other | Attending: Emergency Medicine | Admitting: Emergency Medicine

## 2016-08-13 NOTE — ED Notes (Signed)
Pt sts she does not want to wait that she is gong home

## 2017-04-22 ENCOUNTER — Encounter: Payer: Self-pay | Admitting: *Deleted
